# Patient Record
Sex: Male | Born: 1960 | Race: White | Hispanic: No | Marital: Married | Smoking: Never smoker
Health system: Southern US, Community
[De-identification: ages and names within clinical notes are randomized; demographics above are authoritative.]

## PROBLEM LIST (undated history)

## (undated) DIAGNOSIS — R1310 Dysphagia, unspecified: Secondary | ICD-10-CM

## (undated) DIAGNOSIS — K219 Gastro-esophageal reflux disease without esophagitis: Secondary | ICD-10-CM

## (undated) DIAGNOSIS — E785 Hyperlipidemia, unspecified: Secondary | ICD-10-CM

## (undated) HISTORY — PX: OTHER SURGICAL HISTORY: SHX169

---

## 2005-09-10 ENCOUNTER — Encounter (INDEPENDENT_AMBULATORY_CARE_PROVIDER_SITE_OTHER): Payer: Self-pay | Admitting: *Deleted

## 2005-09-10 ENCOUNTER — Ambulatory Visit (HOSPITAL_COMMUNITY): Admission: RE | Admit: 2005-09-10 | Discharge: 2005-09-10 | Payer: Self-pay | Admitting: Internal Medicine

## 2005-09-10 ENCOUNTER — Ambulatory Visit: Payer: Self-pay | Admitting: Internal Medicine

## 2005-09-13 ENCOUNTER — Ambulatory Visit (HOSPITAL_COMMUNITY): Admission: RE | Admit: 2005-09-13 | Discharge: 2005-09-13 | Payer: Self-pay | Admitting: Internal Medicine

## 2005-09-20 ENCOUNTER — Observation Stay (HOSPITAL_COMMUNITY): Admission: EM | Admit: 2005-09-20 | Discharge: 2005-09-22 | Payer: Self-pay | Admitting: Emergency Medicine

## 2009-12-08 ENCOUNTER — Emergency Department (HOSPITAL_COMMUNITY): Admission: EM | Admit: 2009-12-08 | Discharge: 2009-12-08 | Payer: Self-pay | Admitting: Emergency Medicine

## 2009-12-14 ENCOUNTER — Encounter (INDEPENDENT_AMBULATORY_CARE_PROVIDER_SITE_OTHER): Payer: Self-pay | Admitting: *Deleted

## 2010-04-10 NOTE — Letter (Signed)
Summary: Recall, Screening Colonoscopy Only  [facility]  [address]   [place], [place] [address]   Phone: [phone]  Fax: [phone]    December 14, 2009  NORVELL CASWELL [address] E[place] Jarvis [place], [place]  [address] 1961-02-23   Dear Mr. VEASEY,   Our records indicate it is time to schedule your upper endoscopy.  However, our office has been unable to contact you by phone.   Please call our office at [phone] and ask for the referral coordinator.   Thank you,    Ave Filter  [facility] Ph: [phone]   Fax: [phone]

## 2010-07-27 NOTE — H&P (Signed)
Victor Mccoy, Victor Mccoy             ACCOUNT NO.:  [id number]   MEDICAL RECORD NO.:  [id number]          PATIENT TYPE:  OBV   LOCATION:  A321                          FACILITY:  [facility]   PHYSICIAN:  Kingsley Callander. Ouida Sills, MD       DATE OF BIRTH:  03-01-1961   DATE OF ADMISSION:  09/20/2005  DATE OF DISCHARGE:  LH                                HISTORY & PHYSICAL   CHIEF COMPLAINT:  Headache.   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  The patient began feeling a diffuse  throbbing headache approximately two weeks ago.  There had been no known  trauma to his head.  He had been seen at the office and his condition had  been discussed several times by telephone. He had failed treatment with  Relpax.  A prednisone taper was ineffective. He had been mildly hypertensive  and was treated with Tarka without any relief. He had been treated with  hydrocodone and Midrin, as well, without relief. A CT scan of the brain has  been negative. He had experienced periods where if he became completely  still and supine, his headache had improved. He has not had vomiting.  He  has previously been treated for a migraine several years ago.  He has a  family history of migraine in a sister.   PAST MEDICAL HISTORY:  Hypercholesterolemia.   MEDICATIONS:  Lipitor 20 mg daily, Tarka 2/240 mg daily, Vicodin q.4 p.r.n.   ALLERGIES:  None.   SOCIAL HISTORY:  He does not smoke cigarettes.  He does not use recreational  drugs. He does not abuse alcohol   FAMILY HISTORY:  His father has had prostate cancer.  His mother has  hypercholesterolemia.  His sister has migraine headaches and  hypercholesterolemia.   REVIEW OF SYSTEMS:  No vomiting, diarrhea, chest pain, difficulty voiding,  fever, chills, or rash.  He has had multiple tick bites but no erythema  migrans type rash or any  rash to suggest [facility] Spotted Fever.   PHYSICAL EXAMINATION:  VITAL SIGNS:  Temperature 98.6, pulse 94, respirations 18, blood pressure  144/97.  GENERAL: Initially he was extremely uncomfortable appearing.  HEENT: Pupils equal and reactive to light.  Extraocular movements intact.  Oropharynx normal.  Face symmetric.  NECK: Supple.  No JVD or lymphadenopathy.  LUNGS: Clear.  HEART: Regular with no murmurs.  ABDOMEN:  Nontender.  No hepatosplenomegaly.  EXTREMITIES: No cyanosis, clubbing or edema.  NEUROLOGICAL:  Normal.  LYMPH NODES:  No enlargement.  SKIN:  Normal.   LABORATORY DATA:  White count 8.9, hemoglobin 16.3, platelets 285,000.  Sodium 134, potassium 4.4, bicarb 29, glucose 109, BUN 12, creatinine 0.9,  calcium 9.2 AST 24, ALT 56.   IMPRESSION:  Intractable headache. Admit for additional evaluation with a  lumbar puncture and evaluation of cerebrospinal fluid.  I would like to rule  out a low CSF pressure headache.  Will likely consult neurology.  Will treat  with  IV DHE.      Kingsley Callander. Ouida Sills, MD  Electronically Signed     ROF/MEDQ  D:  09/21/2005  T:  09/21/2005  Job:  [id number]

## 2010-07-27 NOTE — Op Note (Signed)
[name], [name]             ACCOUNT NO.:  [id number]   MEDICAL RECORD NO.:  [id number]          PATIENT TYPE:  AMB   LOCATION:  DAY                           FACILITY:  [facility]   PHYSICIAN:  Lionel December, M.D.    DATE OF BIRTH:  1960-05-11   DATE OF PROCEDURE:  09/10/2005  DATE OF DISCHARGE:                                 OPERATIVE REPORT   PROCEDURE:  Esophagogastroduodenoscopy with esophageal dilation.   INDICATIONS:  Victor Mccoy is a 50 year old Caucasian male with a 6 month history  of intermittent dysphagia primarily to solids experienced more and more  frequently but he denies heartburn.  He is undergoing diagnostic and  therapeutic procedure.  Procedure and risks were reviewed with the patient  and informed consent was obtained.   MEDS FOR CONSCIOUS SEDATION:  Benzocaine spray for pharyngeal topical  anesthesia. Demerol 50 mg IV, Versed 10 mg IV.   FINDINGS:  Procedure performed in endoscopy suite.  The patient's vital  signs and O2 sat were monitored during the procedure and remained stable.   The patient was placed in the left lateral decubitus position. The Olympus  videoscope was passed via the oropharynx without any difficulty into  esophagus.   ESOPHAGUS:  The mucosa of the esophagus was normal proximally and middle  third, but distally there was a ring 3 cm proximal to the GE junction was  some mucosal wrinkling.  The GE junction was at 40 cm from the incisors.  No  hernia was noted.   STOMACH:  It was empty and distended very well with insufflation.  The folds  of the proximal stomach were normal.  Examination of the mucosa revealed a  few erosions at antrum, pyloric channel was patent.  Angularis, fundus and  cardia were examined by retroflexing the scope and were normal.   DUODENUM:  Bulbar mucosa was normal.  The scope was passed to the second  part of the duodenum where mucosa and folds were normal.  Endoscope was  withdrawn.   The esophagus was dilated  by passing 54 and 56-French Maloney dilators.  As  the dilation was completed, the endoscope was passed and again there was a 2  cm linear 5-6 mm wide tear at distal esophagus extending about a centimeter  proximal to the GE junction.  Pictures taken for the record.  Biopsy was  taken from mucosa of the distal esophagus away from the tear looking for  eosinophilic esophagitis.  Endoscope was withdrawn.  The patient tolerated  the procedure well.   FINAL DIAGNOSIS:  1.  Distal esophageal ring along with mucosal wrinkling proximal to GE      junction suspicious for eosinophilic esophagitis.  Esophagus dilated by      passing 54 and 56-French Maloney dilator and ring was effectively      disrupted.  2.  Erosive antral gastritis.   Biopsy taken from distal esophagus.   RECOMMENDATIONS:  The patient advised to stay on the soft foods for 2 days.  H pylori serology will be checked today.  I will be contacting the patient  with biopsy results and  further recommendations.      Lionel December, M.D.  Electronically Signed     NR/MEDQ  D:  09/10/2005  T:  09/10/2005  Job:  [id number]   cc:   Kingsley Callander. Ouida Sills, MD  Fax: [phone]

## 2010-07-27 NOTE — Consult Note (Signed)
NAME:  Victor Mccoy, Victor Mccoy             ACCOUNT NO.:  [id number]   MEDICAL RECORD NO.:  [id number]          PATIENT TYPE:  OBV   LOCATION:  A321                          FACILITY:  [facility]   PHYSICIAN:  Kofi A. Gerilyn Pilgrim, M.D. DATE OF BIRTH:  1960/11/29   DATE OF CONSULTATION:  09/20/2005  DATE OF DISCHARGE:  09/22/2005                                   CONSULTATION   HISTORY:  A 50 year old right-handed male who presented to the emergency  room with headache for the past two weeks.  The patient has had history of  headaches that occurs episodically for years but indicates that nothing has  lasted this long.  Essentially both sides of the head are involved, both the  front and posterior aspect.  He does report significant photophobia and  sonophobia.  Some nausea is also reported but no emesis.  The patient has  been treated with Relpax and Imitrex for these recent headaches without much  benefit.  He also was given prednisone taper over a week with no benefit.  Additional treatment includes Tarka, hydrocodone and Midrin, all not  providing significant benefits.  The patient underwent a lumbar spinal tap  which showed an opening pressure of 11 cm of water.  The wbc was 1, rbc 10,  protein of 49, glucose 59, peripheral glucose 109.  Other electrolytes and  liver enzymes unrevealing.  WBC serum is 8.9, hemoglobin 16, platelet count  285.   The patient was started on IV DHE 0.5 mg every six hours and has had  significant improvement, although he has residual headaches for over 10,  again involving both sides of the head.   PAST MEDICAL HISTORY:  Hypercholesterolemia.   MEDICATIONS ON ADMISSION:  1.  Lipitor.  2.  Tarka 2/240.  3.  Vicodin.   ALLERGIES:  NO KNOWN DRUG ALLERGIES.   SOCIAL HISTORY:  Does not smoke, does not use any recreational drugs.  Does  not use alcohol.   FAMILY HISTORY:  Significant for migraines, hypercholesterolemia.   REVIEW OF SYSTEMS:  As stated in the history  of present illness, essentially  unrevealing.  There is no history of tick bites, fevers or rash.   PHYSICAL EXAMINATION:  VITAL SIGNS:  Temperature 98.3, pulse 63,  respirations 20, blood pressure 129/71.  HEENT:  Head is normocephalic and atraumatic.  NECK:  Supple.  LUNGS:  Clear to auscultation bilaterally.  CARDIOVASCULAR:  Normal S1 and S2.  ABDOMEN:  Soft.  EXTREMITIES:  No significant varicosities or edema.  No rashes seen.  MENTATION:  The patient is awake and alert, he converses well.  Speech,  language and cognition are intact.  NEUROLOGIC:  Cranial nerves are equal, round, reactive to light and  accommodation.  Visual fields are intact.  Extraocular movements are full.  Facial muscle strength is symmetric.  Tongue is midline.  Uvula is midline.  Shoulder shrugs are normal.  Motor examination shows normal tone, bulk and  strength.  There is no pronator drift.  Coordination is intact.  Sensation  normal to temperature and light touch.   CT scan of the brain is  unremarkable.   LABORATORY DATA:  Laboratory evaluation had been reviewed in the HPI.   ASSESSMENT:  Likely prolonged migraine in a patient with baseline history of  episodic migrainous headaches.   RECOMMENDATIONS:  Increase DHE to 1 mg every eight hours.  Continue with  verapamil prophylactic care. This will probably take some time to work.   Thank you for this consultation.      Kofi A. Gerilyn Pilgrim, M.D.  Electronically Signed     KAD/MEDQ  D:  09/23/2005  T:  09/23/2005  Job:  [id number]

## 2010-07-27 NOTE — Discharge Summary (Signed)
NAMEGERHARD, RAPPAPORT             ACCOUNT NO.:  [id number]   MEDICAL RECORD NO.:  [id number]          PATIENT TYPE:  OBV   LOCATION:  A321                          FACILITY:  [facility]   PHYSICIAN:  Kingsley Callander. Ouida Sills, MD       DATE OF BIRTH:  [birth year]   DATE OF ADMISSION:  09/20/2005  DATE OF DISCHARGE:  07/15/2007LH                                 DISCHARGE SUMMARY   DISCHARGE DIAGNOSES:  1.  Intractable migraine headache.  2.  Hypertension.  3.  Hypercholesterolemia.   PROCEDURES:  Lumbar puncture.   HOSPITAL COURSE:  This patient is a 50 year old white male who presented  with intractable headache of 2 weeks' duration.  A CT scan had previously  been negative.  He had not experienced fever.  His white count was normal at  8.9.  He underwent a lumbar puncture which revealed an opening pressure of  110 mm of water.  His gram stain of the spinal fluid was negative.  The CSF  protein was mildly elevated at 49.  CSF glucose was normal at 59.  His  culture has remained negative.  I initially attempted the procedure but was  unable to successfully complete it.  Dr. Jena Gauss from radiology was  consulted and with fluoroscopy was able to perform the lumbar puncture.  After low-pressure CSF headache was ruled out, he was felt to likely have  intractable migraine.  He was seen in neurology consultation by Dr. Gerilyn Pilgrim  who agreed.  He was treated with IV dihydroergotamine.  His initial dose was  0.5 mg q.6h.  Dr. Gerilyn Pilgrim increased his dose to 1 mg q.8h.  His headache  improved significantly.  He was stable for discharge on the 15th for  followup in the office in one week.  He will continue on verapamil as a  suppressive agent and will be prescribed Maxalt to use on a p.r.n. basis.   DISCHARGE MEDICATIONS:  1.  Lipitor 110 mg daily.  2.  Tarka 2/240 mg daily.  3.  Ativan 1 mg q.4h. p.r.n.  4.  Maxalt-MLT 10 mg q.d. p.r.n. with a repeat of 1 in 1-2 hours if needed.      Kingsley Callander. Ouida Sills, MD  Electronically Signed     ROF/MEDQ  D:  09/22/2005  T:  09/22/2005  Job:  [id number]

## 2011-08-19 ENCOUNTER — Observation Stay (HOSPITAL_COMMUNITY)
Admission: EM | Admit: 2011-08-19 | Discharge: 2011-08-19 | DRG: 189 | Disposition: A | Discharge status: Home / Self Care | Payer: BC Managed Care – PPO | Attending: General Surgery | Admitting: General Surgery

## 2011-08-19 ENCOUNTER — Other Ambulatory Visit: Payer: Self-pay | Admitting: Gastroenterology

## 2011-08-19 ENCOUNTER — Emergency Department (HOSPITAL_COMMUNITY): Payer: BC Managed Care – PPO

## 2011-08-19 ENCOUNTER — Encounter (HOSPITAL_COMMUNITY)
Admission: EM | Disposition: A | Discharge status: Home / Self Care | Payer: Self-pay | Source: Home / Self Care | Attending: Emergency Medicine

## 2011-08-19 ENCOUNTER — Telehealth: Payer: Self-pay | Admitting: Gastroenterology

## 2011-08-19 ENCOUNTER — Encounter (HOSPITAL_COMMUNITY): Payer: Self-pay | Admitting: Emergency Medicine

## 2011-08-19 DIAGNOSIS — K2970 Gastritis, unspecified, without bleeding: Secondary | ICD-10-CM

## 2011-08-19 DIAGNOSIS — K2990 Gastroduodenitis, unspecified, without bleeding: Secondary | ICD-10-CM | POA: Insufficient documentation

## 2011-08-19 DIAGNOSIS — T18128A Food in esophagus causing other injury, initial encounter: Secondary | ICD-10-CM

## 2011-08-19 DIAGNOSIS — T18108A Unspecified foreign body in esophagus causing other injury, initial encounter: Principal | ICD-10-CM | POA: Diagnosis present

## 2011-08-19 DIAGNOSIS — K222 Esophageal obstruction: Secondary | ICD-10-CM

## 2011-08-19 DIAGNOSIS — R1310 Dysphagia, unspecified: Secondary | ICD-10-CM

## 2011-08-19 DIAGNOSIS — K208 Other esophagitis without bleeding: Secondary | ICD-10-CM

## 2011-08-19 DIAGNOSIS — E785 Hyperlipidemia, unspecified: Secondary | ICD-10-CM | POA: Diagnosis present

## 2011-08-19 DIAGNOSIS — IMO0002 Reserved for concepts with insufficient information to code with codable children: Secondary | ICD-10-CM | POA: Diagnosis present

## 2011-08-19 DIAGNOSIS — K209 Esophagitis, unspecified without bleeding: Secondary | ICD-10-CM | POA: Insufficient documentation

## 2011-08-19 HISTORY — DX: Hyperlipidemia, unspecified: E78.5

## 2011-08-19 HISTORY — DX: Dysphagia, unspecified: R13.10

## 2011-08-19 LAB — DIFFERENTIAL
Basophils Absolute: 0 10*3/uL (ref 0.0–0.1)
Basophils Relative: 1 % (ref 0–1)
Eosinophils Absolute: 0.6 10*3/uL (ref 0.0–0.7)
Eosinophils Relative: 8 % — ABNORMAL HIGH (ref 0–5)
Lymphocytes Relative: 27 % (ref 12–46)
Lymphs Abs: 2.1 10*3/uL (ref 0.7–4.0)
Monocytes Absolute: 0.6 10*3/uL (ref 0.1–1.0)
Monocytes Relative: 8 % (ref 3–12)
Neutrophils Relative %: 57 % (ref 43–77)

## 2011-08-19 LAB — CBC
Hemoglobin: 16.2 g/dL (ref 13.0–17.0)
RBC: 5.18 MIL/uL (ref 4.22–5.81)
RDW: 12.6 % (ref 11.5–15.5)

## 2011-08-19 LAB — BASIC METABOLIC PANEL
Calcium: 9.7 mg/dL (ref 8.4–10.5)
Chloride: 99 mEq/L (ref 96–112)
Creatinine, Ser: 0.95 mg/dL (ref 0.50–1.35)
GFR calc Af Amer: >90 mL/min (ref >90)
Glucose, Bld: 101 mg/dL — ABNORMAL HIGH (ref 70–99)

## 2011-08-19 SURGERY — ESOPHAGOGASTRODUODENOSCOPY (EGD) WITH ESOPHAGEAL DILATION
Anesthesia: Moderate Sedation

## 2011-08-19 MED ORDER — SODIUM CHLORIDE 0.9 % IV BOLUS (SEPSIS)
250.0000 mL | Freq: Once | INTRAVENOUS | Status: AC
  Administered 2011-08-19: 250 mL via INTRAVENOUS

## 2011-08-19 MED ORDER — OMEPRAZOLE 20 MG PO CPDR: DELAYED_RELEASE_CAPSULE | ORAL | Status: DC

## 2011-08-19 MED ORDER — LORAZEPAM 2 MG/ML IJ SOLN
1.0000 mg | Freq: Once | INTRAMUSCULAR | Status: AC
  Administered 2011-08-19: 1 mg via INTRAVENOUS
  Filled 2011-08-19: qty 1

## 2011-08-19 MED ORDER — MIDAZOLAM HCL 5 MG/5ML IJ SOLN
INTRAMUSCULAR | Status: AC
  Filled 2011-08-19: qty 10

## 2011-08-19 MED ORDER — MIDAZOLAM HCL 5 MG/5ML IJ SOLN
INTRAMUSCULAR | Status: DC | PRN
  Administered 2011-08-19 (×2): 2 mg via INTRAVENOUS

## 2011-08-19 MED ORDER — SODIUM CHLORIDE 0.9 % IV SOLN
INTRAVENOUS | Status: DC
  Administered 2011-08-19: 04:00:00 via INTRAVENOUS

## 2011-08-19 MED ORDER — ONDANSETRON HCL 4 MG/2ML IJ SOLN: 4.0000 mg | Freq: Three times a day (TID) | INTRAMUSCULAR | Status: DC | PRN

## 2011-08-19 MED ORDER — BUTAMBEN-TETRACAINE-BENZOCAINE 2-2-14 % EX AERO
INHALATION_SPRAY | CUTANEOUS | Status: DC | PRN
  Administered 2011-08-19: 2 via TOPICAL

## 2011-08-19 MED ORDER — ONDANSETRON HCL 4 MG/2ML IJ SOLN
4.0000 mg | Freq: Once | INTRAMUSCULAR | Status: AC
  Administered 2011-08-19: 4 mg via INTRAVENOUS
  Filled 2011-08-19: qty 2

## 2011-08-19 MED ORDER — SODIUM CHLORIDE 0.9 % IV SOLN: INTRAVENOUS | Status: DC

## 2011-08-19 MED ORDER — MEPERIDINE HCL 100 MG/ML IJ SOLN
INTRAMUSCULAR | Status: AC
  Filled 2011-08-19: qty 1

## 2011-08-19 MED ORDER — SODIUM CHLORIDE 0.45 % IV SOLN
INTRAVENOUS | Status: DC
  Administered 2011-08-19: 11:00:00 via INTRAVENOUS

## 2011-08-19 MED ORDER — GLUCAGON HCL (RDNA) 1 MG IJ SOLR
1.0000 mg | Freq: Once | INTRAMUSCULAR | Status: AC
  Administered 2011-08-19: 1 mg via INTRAVENOUS
  Filled 2011-08-19: qty 1

## 2011-08-19 MED ORDER — MEPERIDINE HCL 100 MG/ML IJ SOLN
INTRAMUSCULAR | Status: DC | PRN
  Administered 2011-08-19 (×2): 50 mg via INTRAVENOUS

## 2011-08-19 MED ORDER — HYDROMORPHONE HCL PF 1 MG/ML IJ SOLN: 1.0000 mg | INTRAMUSCULAR | Status: DC | PRN

## 2011-08-19 NOTE — ED Provider Notes (Addendum)
History     CSN: [id number]  Arrival date & time 08/19/11  0126   First MD Initiated Contact with Patient 08/19/11 0222      Chief Complaint  Patient presents with  . Swallowed Foreign Body    (Consider location/radiation/quality/duration/timing/severity/associated sxs/prior treatment) Patient is a 51 y.o. male presenting with foreign body swallowed. The history is provided by the patient and the spouse.  Swallowed Foreign Body This is a new problem. The current episode started 6 to 12 hours ago (at 8:00 PM). The problem occurs constantly. The problem has not changed since onset.Pertinent negatives include no chest pain, no abdominal pain, no headaches and no shortness of breath. The symptoms are aggravated by nothing. The symptoms are relieved by nothing.   Patient was eating chicken at 8:00 PM it got stuck in his throat is not resolved not able to drink any water comes back up also saliva comes back up. Patient with past history of esophageal stricture with a stretched esophagus by GI medicine about 5 years ago. Past Medical History  Diagnosis Date  . Dysphagia   . Hyperlipidemia     Past Surgical History  Procedure Date  . Stretched esophagus     History reviewed. No pertinent family history.  History  Substance Use Topics  . Smoking status: Never Smoker   . Smokeless tobacco: Not on file  . Alcohol Use: Yes     occasionally      Review of Systems  Constitutional: Negative for fever and chills.  HENT: Positive for trouble swallowing. Negative for congestion.   Eyes: Negative for visual disturbance.  Respiratory: Negative for cough, choking and shortness of breath.   Cardiovascular: Negative for chest pain.  Gastrointestinal: Negative for nausea, vomiting and abdominal pain.  Genitourinary: Negative for dysuria and decreased urine volume.  Musculoskeletal: Negative for back pain.  Skin: Negative for rash.  Neurological: Negative for headaches.  Hematological:  Does not bruise/bleed easily.    Allergies  Review of patient's allergies indicates no known allergies.  Home Medications   Current Outpatient Rx  Name Route Sig Dispense Refill  . ATORVASTATIN CALCIUM 20 MG PO TABS Oral Take 20 mg by mouth daily.      BP 160/98  Pulse 75  Temp(Src) 97.6 F (36.4 C) (Oral)  Resp 20  Ht 6' (1.829 m)  Wt 208 lb (94.348 kg)  BMI 28.21 kg/m2  SpO2 99%  Physical Exam  Nursing note and vitals reviewed. Constitutional: He is oriented to person, place, and time. He appears well-developed and well-nourished. No distress.  HENT:  Head: Normocephalic and atraumatic.  Mouth/Throat: Oropharynx is clear and moist.  Eyes: Conjunctivae and EOM are normal. Pupils are equal, round, and reactive to light.  Neck: Normal range of motion. Neck supple. No tracheal deviation present.  Cardiovascular: Normal rate, regular rhythm and normal heart sounds.   No murmur heard. Pulmonary/Chest: Effort normal and breath sounds normal. No stridor. He has no wheezes.  Abdominal: Soft. Bowel sounds are normal. There is no tenderness.  Musculoskeletal: Normal range of motion.  Lymphadenopathy:    He has no cervical adenopathy.  Neurological: He is alert and oriented to person, place, and time. No cranial nerve deficit. He exhibits normal muscle tone. Coordination normal.  Skin: Skin is warm. No rash noted. He is not diaphoretic.    ED Course  Procedures (including critical care time)  Labs Reviewed  BASIC METABOLIC PANEL - Abnormal; Notable for the following:    Glucose, Bld 101 (*)  All other components within normal limits  DIFFERENTIAL - Abnormal; Notable for the following:    Eosinophils Relative 8 (*)    All other components within normal limits  CBC   Dg Chest 2 View  08/19/2011  *RADIOLOGY REPORT*  Clinical Data: Hoarseness, possible foreign body.  CHEST - 2 VIEW  Comparison: None.  Findings: Lungs are clear. No pleural effusion or pneumothorax. The  cardiomediastinal contours are within normal limits. The visualized bones and soft tissues are without significant appreciable abnormality.  IMPRESSION: No radiopaque foreign body or radiographic evidence of acute cardiopulmonary process.  Original Report Authenticated By: Waneta Martins, M.D.   Results for orders placed during the hospital encounter of 08/19/11  BASIC METABOLIC PANEL      Component Value Range   Sodium 136  135 - 145 (mEq/L)   Potassium 4.5  3.5 - 5.1 (mEq/L)   Chloride 99  96 - 112 (mEq/L)   CO2 27  19 - 32 (mEq/L)   Glucose, Bld 101 (*) 70 - 99 (mg/dL)   BUN 11  6 - 23 (mg/dL)   Creatinine, Ser 1.61  0.50 - 1.35 (mg/dL)   Calcium 9.7  8.4 - 09.6 (mg/dL)   GFR calc non Af Amer >90  >90 (mL/min)   GFR calc Af Amer >90  >90 (mL/min)  CBC      Component Value Range   WBC 7.8  4.0 - 10.5 (K/uL)   RBC 5.18  4.22 - 5.81 (MIL/uL)   Hemoglobin 16.2  13.0 - 17.0 (g/dL)   HCT 04.5  40.9 - 81.1 (%)   MCV 87.6  78.0 - 100.0 (fL)   MCH 31.3  26.0 - 34.0 (pg)   MCHC 35.7  30.0 - 36.0 (g/dL)   RDW 91.4  78.2 - 95.6 (%)   Platelets 247  150 - 400 (K/uL)  DIFFERENTIAL      Component Value Range   Neutrophils Relative 57  43 - 77 (%)   Neutro Abs 4.5  1.7 - 7.7 (K/uL)   Lymphocytes Relative 27  12 - 46 (%)   Lymphs Abs 2.1  0.7 - 4.0 (K/uL)   Monocytes Relative 8  3 - 12 (%)   Monocytes Absolute 0.6  0.1 - 1.0 (K/uL)   Eosinophils Relative 8 (*) 0 - 5 (%)   Eosinophils Absolute 0.6  0.0 - 0.7 (K/uL)   Basophils Relative 1  0 - 1 (%)   Basophils Absolute 0.0  0.0 - 0.1 (K/uL)     1. Food impaction of esophagus       MDM  Patient had chicken stuck in his esophagus the upper part red the base of the neck at about 8:00 in the evening past history of some esophageal stricture problems with dilatation by Dr. Work patient's been trying to drink water will not go down saliva also comes back up here in the emergency department treated him with glucagon and some Ativan no  change. Discussed with Dr. Leticia Penna is on call for general surgery admitting tonight for GI he will omit and contact GI medicine in the next few hours to remove the foreign body. Patient's chest x-ray is negative for any evidence of esophageal perforation.        Shelda Jakes, MD 08/19/11 2130  Shelda Jakes, MD 08/20/11 [phone]

## 2011-08-19 NOTE — ED Notes (Signed)
Patient stated he swallowed one bite of chicken and one bite of salad and food is stuck in his throat. Has tried to drink water but states "it builds up and comes back up." Has history of same.

## 2011-08-19 NOTE — Care Management Note (Signed)
    Page 1 of 1   08/19/2011     10:35:14 AM   CARE MANAGEMENT NOTE 08/19/2011  Patient:  Victor Mccoy, Victor Mccoy   Account Number:  [id number]  Date Initiated:  08/19/2011  Documentation initiated by:  Sharrie Rothman  Subjective/Objective Assessment:   Pt admitted from  home with food impaction. Pt lives with wife and is independent with ADL's. Pt will return home at discharge.     Action/Plan:   No CM needs noted.   Anticipated DC Date:  08/21/2011   Anticipated DC Plan:  HOME/SELF CARE      DC Planning Services  CM consult      Choice offered to / List presented to:             Status of service:  Completed, signed off Medicare Important Message given?   (If response is "NO", the following Medicare IM given date fields will be blank) Date Medicare IM given:   Date Additional Medicare IM given:    Discharge Disposition:    Per UR Regulation:    If discussed at Long Length of Stay Meetings, dates discussed:    Comments:  08/19/11 1034 Arlyss Queen, RN BSN CM

## 2011-08-19 NOTE — Telephone Encounter (Deleted)
Ok. Thank You! °

## 2011-08-19 NOTE — ED Notes (Signed)
Gave patient water to drink as verbally ordered by MD. Patient took 2-3 swallows of water and vomited within 30 seconds of drinking water. Advised MD.

## 2011-08-19 NOTE — Telephone Encounter (Signed)
Pt needs EGD/DILATION TIMESLOT.

## 2011-08-19 NOTE — Telephone Encounter (Signed)
I have patient scheduled for repeat EGD w/SLF on Tuesday 08/27/11 at 10:30, patients prep instructions were mailed.

## 2011-08-19 NOTE — H&P (Addendum)
  Primary Care Physician:  FAGAN,ROY, MD, MD Primary Gastroenterologist:  Dr. Keiran Sias  Pre-Procedure History & Physical: HPI:  Victor Mccoy is a 51 y.o. male here for ?FOOD IMPACTION-GRILLED CHICKEN/SALAD LAST NIGHT AROUND 8 PM. No NSAIDS. RARE ETOH. No problems with sedation. No nausea, vomiting, melena, diarrhea, constipation, and pain, or heartburn or indigestion. NEVER HAD A TCS.    Past Medical History  Diagnosis Date  . Dysphagia   . Hyperlipidemia     Past Surgical History  Procedure Date  . Stretched esophagus     Prior to Admission medications   Medication Sig Start Date End Date Taking? Authorizing Provider  atorvastatin (LIPITOR) 20 MG tablet Take 20 mg by mouth daily.   Yes Historical Provider, MD  fenofibrate (TRICOR) 145 MG tablet Take 145 mg by mouth daily.   Yes Historical Provider, MD    Allergies as of 08/19/2011  . (No Known Allergies)    History reviewed. No pertinent family history.  History   Social History  . Marital Status: Single    Spouse Name: N/A    Number of Children: N/A  . Years of Education: N/A   Occupational History  . Not on file.   Social History Main Topics  . Smoking status: Never Smoker   . Smokeless tobacco: Not on file  . Alcohol Use: Yes     occasionally  . Drug Use: No  . Sexually Active:    Other Topics Concern  . Not on file   Social History Narrative  . No narrative on file    Review of Systems: See HPI, otherwise negative ROS   Physical Exam: BP 157/103  Pulse 74  Temp(Src) 98.2 F (36.8 C) (Oral)  Resp 18  Ht 6' (1.829 m)  Wt 210 lb (95.255 kg)  BMI 28.48 kg/m2  SpO2 95% General:   Alert,  pleasant and cooperative in NAD Head:  Normocephalic and atraumatic. Neck:  Supple;  Lungs:  Clear throughout to auscultation.    Heart:  Regular rate and rhythm. Abdomen:  Soft, nontender and nondistended. Normal bowel sounds, without guarding, and without rebound.   Neurologic:  Alert and  oriented x4;   grossly normal neurologically.  Impression/Plan:     FOOD IMPACTION  PLAN:  EGD/?DIL TODAY  TCS IN NEAR FUTURE 

## 2011-08-19 NOTE — ED Notes (Signed)
Patient states he ate one bite of chicken and one bite of salad and food is stuck and will not go down. States "this happens sometimes but I usually drink water and it will finally go down, but tonight it won't go down." No obvious distress noted.

## 2011-08-19 NOTE — Progress Notes (Signed)
Brief Note  Pt trigger on Nutrition Screen due to swallow difficulty. Pt currently NPO and has hx of esophageal stricture with dilatation. His appetite reported to be good and wt stable. No nutrition intervention recommended at this time.  Dietitian [phone]

## 2011-08-19 NOTE — Op Note (Signed)
[facility] [address] [place], [place]  [address]  ENDOSCOPY PROCEDURE REPORT  PATIENT:  Victor, Mccoy  MR#:  [id number] BIRTHDATE:  04/06/1960, 50 yrs. old  GENDER:  male  ENDOSCOPIST:  Jonette Eva, MD ASSISTANT: Referred by:  Carylon Perches, M.D.  PROCEDURE DATE:  08/19/2011 PROCEDURE:  EGD with biopsy, EGD with dilatation over guidewire ASA CLASS: INDICATIONS:  FOOD IMPACTION-DYSPHAGIA DENIES HEARTBURN  PMHX: EGD/DIL 2007-DISTAL Bx EOSINOPHILS-GERD V. EE  MEDICATIONS:   Demerol 100 mg IV, Versed 4 mg IV TOPICAL ANESTHETIC:  Cetacaine Spray  DESCRIPTION OF PROCEDURE:   After the risks benefits and alternatives of the procedure were thoroughly explained, informed consent was obtained.  The EG-[id number] ([id number]) endoscope was introduced through the mouth and advanced to the second portion of the duodenum.  The instrument was slowly withdrawn as the mucosa was carefully examined.  Prior to withdrawal of the scope, the guidwire was placed.  The esophagus was dilated successfully.  The patient was recovered in endoscopy and discharged home in satisfactory condition. <<PROCEDUREIMAGES>>  Esophagitis was found in the distal esophagus LIKEY DUE TO FOOD IMPACTION.  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  Mild gastritis was found & BIOPSIED VIA COLD FORCEPS. MILD Duodenitis was found.    Dilation was then performed at the total esophagus  1) Dilator:  Savary over guidewire  Size(s):  12-14 MM Resistance:  moderate  Heme:  none Appearance:  COMPLICATIONS:  None  ENDOSCOPIC IMPRESSION: 1) Mild gastritis 2) MILD Duodenitis 3) FOOD IMPACTION LIKEY DUE TO PEPTIC STRICTURE. BIOPSIES PENDING TO EVALAUTE FOR EE  RECOMMENDATIONS: PPI BID FOR 3 MOS THEN DAILY FOREVER AWAIT BIOPSIES SOFT MECH LOW FAT DIET REPEAT EGD/DIL JUN 18 OPV IN 4 MOS  REPEAT EXAM:  No  ______________________________ Jonette Eva, MD  CC:  n. eSIGNED:   Rashay Barnette at  08/19/2011 12:49 PM  Arna Snipe, [phone]

## 2011-08-19 NOTE — Discharge Instructions (Signed)
Your FOOD IMPACTION WAS GONE. I dilated your esophagus. You have a stricture near the base of your esophagus. You have mild gastritis AND DUODENITIS. I biopsied your stomach & ESOPHAGUS.   START OMEPRAZOLE. TAKE 30 MINUTES PRIOR TO MEALS. LOSE 10 LBS. FOLLOW A LOW FAT DIET. SEE INFO BELOW. YOUR BIOPSY WILL BE BACK IN 7 DAYS. REPEAT UPPER ENDOSCOPY June 18. FOLLOW UP IN 3 MOS. UPPER ENDOSCOPY AFTER CARE Read the instructions outlined below and refer to this sheet in the next week. These discharge instructions provide you with general information on caring for yourself after you leave the hospital. While your treatment has been planned according to the most current medical practices available, unavoidable complications occasionally occur. If you have any problems or questions after discharge, call DR. Hermenia Fritcher, [phone].  ACTIVITY  You may resume your regular activity, but move at a slower pace for the next 24 hours.   Take frequent rest periods for the next 24 hours.   Walking will help get rid of the air and reduce the bloated feeling in your belly (abdomen).   No driving for 24 hours (because of the medicine (anesthesia) used during the test).   You may shower.   Do not sign any important legal documents or operate any machinery for 24 hours (because of the anesthesia used during the test).    NUTRITION  Drink plenty of fluids.   You may resume your normal diet as instructed by your doctor.   Begin with a light meal and progress to your normal diet. Heavy or fried foods are harder to digest and may make you feel sick to your stomach (nauseated).   Avoid alcoholic beverages for 24 hours or as instructed.    MEDICATIONS  You may resume your normal medications.   WHAT YOU CAN EXPECT TODAY  Some feelings of bloating in the abdomen.   Passage of more gas than usual.    IF YOU HAD A BIOPSY TAKEN DURING THE UPPER ENDOSCOPY:  No aspirin products for 3 days.   Eat a  soft diet IF YOU HAVE NAUSEA, BLOATING, ABDOMINAL PAIN, OR VOMITING.    FINDING OUT THE RESULTS OF YOUR TEST Not all test results are available during your visit. DR. Darrick Penna WILL CALL YOU WITHIN 7 DAYS OF YOUR PROCEDUE WITH YOUR RESULTS. Do not assume everything is normal if you have not heard from DR. Quashawn Jewkes IN ONE WEEK, CALL HER OFFICE AT [phone].  SEEK IMMEDIATE MEDICAL ATTENTION AND CALL THE OFFICE: [phone] IF:  You have more than a spotting of blood in your stool.   Your belly is swollen (abdominal distention).   You are nauseated or vomiting.   You have a temperature over 101F.   You have abdominal pain or discomfort that is severe or gets worse throughout the day.     Gastritis/DUODENITIS  Gastritis is an inflammation (the body's way of reacting to injury and/or infection) of the stomach. DUODENITIS is an inflammation (the body's way of reacting to injury and/or infection) of the FIRST PART OF THE SMALL INTESTINES. It is often caused by bacterial (germ) infections. It can also be caused BY ASPIRIN, BC/GOODY POWDER'S, (IBUPROFEN) MOTRIN, OR ALEVE (NAPROXEN), chemicals (including alcohol), SPICY FOODS, and medications. This illness may be associated with generalized malaise (feeling tired, not well), UPPER ABDOMINAL STOMACH cramps, and fever. One common bacterial cause of gastritis is an organism known as H. Pylori. This can be treated with antibiotics.     ESOPHAGEAL STRICTURE Esophageal strictures  can be caused by stomach acid backing up into the tube that carries food from the mouth down to the stomach (lower esophagus).  TREATMENT There are a number of non-prescription medicines used to treat reflux/stricture, including: Antacids.  Proton-pump inhibitors: OMEPRAZOLE   HOME CARE INSTRUCTIONS Eat 2-3 hours before going to bed.  Try to reach and maintain a healthy weight.  Do not eat just a few very large meals. Instead, eat 4 TO 6 smaller meals throughout the  day.  Try to identify foods and beverages that make your symptoms worse, and avoid these.  Avoid tight clothing.  Do not exercise right after eating.   [email or web address]  Lifestyle and home remedies You may eliminate or reduce the frequency of heartburn by making the following lifestyle changes:   Control your weight. Being overweight is a major risk factor for heartburn and GERD. Excess pounds put pressure on your abdomen, pushing up your stomach and causing acid to back up into your esophagus.    Eat 4-6 small meals daily. This reduces pressure on the lower esophageal sphincter, helping to prevent the valve from opening and acid from washing back into your esophagus.    Loosen your belt. Clothes that fit tightly around your waist put pressure on your abdomen and the lower esophageal sphincter.      Eliminate heartburn triggers. Everyone has specific triggers. Common triggers such as fatty or fried foods, spicy food, tomato sauce, carbonated beverages, alcohol, chocolate, mint, garlic, onion, caffeine and nicotine may make heartburn worse.    Avoid stooping or bending. Tying your shoes is OK. Bending over for longer periods to weed your garden isn't, especially soon after eating.    Don't lie down after a meal. Wait at least three to four hours after eating before going to bed, and don't lie down right after eating.    Alternative medicine   Several home remedies exist for treating GERD, but they provide only temporary relief. They include drinking baking soda (sodium bicarbonate) added to water or drinking other fluids such as baking soda mixed with cream of tartar and water.   Although these liquids create temporary relief by neutralizing, washing away or buffering acids, eventually they aggravate the situation by adding gas and fluid to your stomach, increasing pressure and causing more acid reflux. Further, adding more sodium to your diet may increase your blood pressure and add stress  to your heart, and excessive bicarbonate ingestion can alter the acid-base balance in your body.  Low-Fat Diet BREADS, CEREALS, PASTA, RICE, DRIED PEAS, AND BEANS These products are high in carbohydrates and most are low in fat. Therefore, they can be increased in the diet as substitutes for fatty foods. They too, however, contain calories and should not be eaten in excess. Cereals can be eaten for snacks as well as for breakfast.  Include foods that contain fiber (fruits, vegetables, whole grains, and legumes). Research shows that fiber may lower blood cholesterol levels, especially the water-soluble fiber found in fruits, vegetables, oat products, and legumes. FRUITS AND VEGETABLES It is good to eat fruits and vegetables. Besides being sources of fiber, both are rich in vitamins and some minerals. They help you get the daily allowances of these nutrients. Fruits and vegetables can be used for snacks and desserts. MEATS Limit lean meat, chicken, [place], and fish to no more than 6 ounces per day. Beef, Pork, and Lamb Use lean cuts of beef, pork, and lamb. Lean cuts include:  Extra-lean ground beef.  Arm  roast.  Sirloin tip.  Center-cut ham.  Round steak.  Loin chops.  Rump roast.  Tenderloin.  Trim all fat off the outside of meats before cooking. It is not necessary to severely decrease the intake of red meat, but lean choices should be made. Lean meat is rich in protein and contains a highly absorbable form of iron. Premenopausal women, in particular, should avoid reducing lean red meat because this could increase the risk for low red blood cells (iron-deficiency anemia).  Chicken and [place] These are good sources of protein. The fat of poultry can be reduced by removing the skin and underlying fat layers before cooking. Chicken and [place] can be substituted for lean red meat in the diet. Poultry should not be fried or covered with high-fat sauces. Fish and Shellfish Fish is a good source  of protein. Shellfish contain cholesterol, but they usually are low in saturated fatty acids. The preparation of fish is important. Like chicken and [place], they should not be fried or covered with high-fat sauces. EGGS Egg whites contain no fat or cholesterol. They can be eaten often. Try 1 to 2 egg whites instead of whole eggs in recipes or use egg substitutes that do not contain yolk.  MILK AND DAIRY PRODUCTS Use skim or 1% milk instead of 2% or whole milk. Decrease whole milk, natural, and processed cheeses. Use nonfat or low-fat (2%) cottage cheese or low-fat cheeses made from vegetable oils. Choose nonfat or low-fat (1 to 2%) yogurt. Experiment with evaporated skim milk in recipes that call for heavy cream. Substitute low-fat yogurt or low-fat cottage cheese for sour cream in dips and salad dressings. Have at least 2 servings of low-fat dairy products, such as 2 glasses of skim (or 1%) milk each day to help get your daily calcium intake.  FATS AND OILS Butterfat, lard, and beef fats are high in saturated fat and cholesterol. These should be avoided.Vegetable fats do not contain cholesterol. AVOID coconut oil, palm oil, and palm kernel oil, WHICH are very high in saturated fats. These should be limited. These fats are often used in bakery goods, processed foods, popcorn, oils, and nondairy creamers. Vegetable shortenings and some peanut butters contain hydrogenated oils, which are also saturated fats. Read the labels on these foods and check for saturated vegetable oils.  Desirable liquid vegetable oils are corn oil, cottonseed oil, olive oil, canola oil, safflower oil, soybean oil, and sunflower oil. Peanut oil is not as good, but small amounts are acceptable. Buy a heart-healthy tub margarine that has no partially hydrogenated oils in the ingredients. AVOID Mayonnaise and salad dressings often are made from unsaturated fats.  OTHER EATING TIPS Snacks  Most sweets should be limited as snacks.  They tend to be rich in calories and fats, and their caloric content outweighs their nutritional value. Some good choices in snacks are graham crackers, melba toast, soda crackers, bagels (no egg), English muffins, fruits, and vegetables. These snacks are preferable to snack crackers, [place] fries, and chips. Popcorn should be air-popped or cooked in small amounts of liquid vegetable oil.  Desserts Eat fruit, low-fat yogurt, and fruit ices instead of pastries, cake, and cookies. Sherbet, angel food cake, gelatin dessert, frozen low-fat yogurt, or other frozen products that do not contain saturated fat (pure fruit juice bars, frozen ice pops) are also acceptable.   COOKING METHODS Choose those methods that use little or no fat. They include: Poaching.  Braising.  Steaming.  Grilling.  Baking.  Stir-frying.  Broiling.  Microwaving.  Foods can be cooked in a nonstick pan without added fat, or use a nonfat cooking spray in regular cookware. Limit fried foods and avoid frying in saturated fat. Add moisture to lean meats by using water, broth, cooking wines, and other nonfat or low-fat sauces along with the cooking methods mentioned above. Soups and stews should be chilled after cooking. The fat that forms on top after a few hours in the refrigerator should be skimmed off. When preparing meals, avoid using excess salt. Salt can contribute to raising blood pressure in some people.  EATING AWAY FROM HOME Order entres, potatoes, and vegetables without sauces or butter. When meat exceeds the size of a deck of cards (3 to 4 ounces), the rest can be taken home for another meal. Choose vegetable or fruit salads and ask for low-calorie salad dressings to be served on the side. Use dressings sparingly. Limit high-fat toppings, such as bacon, crumbled eggs, cheese, sunflower seeds, and olives. Ask for heart-healthy tub margarine instead of butter.

## 2011-08-19 NOTE — Progress Notes (Signed)
UR chart review completed.  

## 2011-08-19 NOTE — Progress Notes (Signed)
Received call from [facility] who stated patient will be discharged from recovery area.

## 2011-08-26 MED ORDER — SODIUM CHLORIDE 0.45 % IV SOLN
Freq: Once | INTRAVENOUS | Status: AC
  Administered 2011-08-27: 10:00:00 via INTRAVENOUS

## 2011-08-26 NOTE — Discharge Summary (Signed)
Date of admission 08/19/2011 Date of discharge 08/19/2011   Admission diagnoses: Food impaction of the esophagus Discharge diagnoses: Apparently the same.   Patient was admitted early the morning of 08/19/2011 after evaluation in the emergency department was suspicious for a food impaction of the esophagus. He was admitted temporarily to my service as gastroenterology was not mediately available. He was kept in an n.p.o. status with plans for GI evaluation later in the morning for planned and suspected EGD. He was evaluated and taken to endoscopy by Dr. Darrick Penna.  Both patients initial evaluation and discharge were performed by Dr. Darrick Penna.  Note: I did not have any direct patient care involvement.

## 2011-08-27 ENCOUNTER — Encounter (HOSPITAL_COMMUNITY): Admission: RE | Payer: Self-pay | Source: Ambulatory Visit

## 2011-08-27 ENCOUNTER — Ambulatory Visit (HOSPITAL_COMMUNITY)
Admission: RE | Admit: 2011-08-27 | Discharge: 2011-08-27 | Disposition: A | Discharge status: Home / Self Care | Payer: BC Managed Care – PPO | Source: Ambulatory Visit | Attending: Gastroenterology | Admitting: Gastroenterology

## 2011-08-27 ENCOUNTER — Ambulatory Visit (HOSPITAL_COMMUNITY)
Admission: RE | Admit: 2011-08-27 | Payer: BC Managed Care – PPO | Source: Ambulatory Visit | Admitting: Gastroenterology

## 2011-08-27 ENCOUNTER — Encounter (HOSPITAL_COMMUNITY)
Admission: RE | Disposition: A | Discharge status: Home / Self Care | Payer: Self-pay | Source: Ambulatory Visit | Attending: Gastroenterology

## 2011-08-27 ENCOUNTER — Encounter (HOSPITAL_COMMUNITY): Payer: Self-pay | Admitting: *Deleted

## 2011-08-27 DIAGNOSIS — K2990 Gastroduodenitis, unspecified, without bleeding: Secondary | ICD-10-CM

## 2011-08-27 DIAGNOSIS — K2970 Gastritis, unspecified, without bleeding: Secondary | ICD-10-CM

## 2011-08-27 DIAGNOSIS — K2940 Chronic atrophic gastritis without bleeding: Secondary | ICD-10-CM | POA: Insufficient documentation

## 2011-08-27 DIAGNOSIS — K222 Esophageal obstruction: Secondary | ICD-10-CM

## 2011-08-27 DIAGNOSIS — K2100 Gastro-esophageal reflux disease with esophagitis, without bleeding: Secondary | ICD-10-CM | POA: Insufficient documentation

## 2011-08-27 DIAGNOSIS — R1310 Dysphagia, unspecified: Secondary | ICD-10-CM | POA: Insufficient documentation

## 2011-08-27 HISTORY — PX: MALONEY DILATION: SHX5535

## 2011-08-27 HISTORY — PX: SAVORY DILATION: SHX5439

## 2011-08-27 HISTORY — DX: Gastro-esophageal reflux disease without esophagitis: K21.9

## 2011-08-27 SURGERY — EGD (ESOPHAGOGASTRODUODENOSCOPY)
Anesthesia: Moderate Sedation

## 2011-08-27 SURGERY — ESOPHAGOGASTRODUODENOSCOPY (EGD) WITH ESOPHAGEAL DILATION
Anesthesia: Moderate Sedation

## 2011-08-27 MED ORDER — OMEPRAZOLE 20 MG PO CPDR: DELAYED_RELEASE_CAPSULE | ORAL | Status: DC

## 2011-08-27 MED ORDER — MEPERIDINE HCL 100 MG/ML IJ SOLN
INTRAMUSCULAR | Status: DC | PRN
  Administered 2011-08-27: 50 mg via INTRAVENOUS
  Administered 2011-08-27: 25 mg via INTRAVENOUS

## 2011-08-27 MED ORDER — FLUTICASONE PROPIONATE HFA 220 MCG/ACT IN AERO
INHALATION_SPRAY | RESPIRATORY_TRACT | Status: AC
Start: 2011-08-27 — End: 2012-08-26

## 2011-08-27 MED ORDER — MIDAZOLAM HCL 5 MG/5ML IJ SOLN
INTRAMUSCULAR | Status: DC | PRN
  Administered 2011-08-27 (×2): 2 mg via INTRAVENOUS

## 2011-08-27 MED ORDER — BUTAMBEN-TETRACAINE-BENZOCAINE 2-2-14 % EX AERO
INHALATION_SPRAY | CUTANEOUS | Status: DC | PRN
  Administered 2011-08-27: 2 via TOPICAL

## 2011-08-27 MED ORDER — MEPERIDINE HCL 100 MG/ML IJ SOLN
INTRAMUSCULAR | Status: AC
  Filled 2011-08-27: qty 1

## 2011-08-27 MED ORDER — MIDAZOLAM HCL 5 MG/5ML IJ SOLN
INTRAMUSCULAR | Status: AC
  Filled 2011-08-27: qty 10

## 2011-08-27 MED ORDER — STERILE WATER FOR IRRIGATION IR SOLN
Status: DC | PRN
  Administered 2011-08-27: 10:00:00

## 2011-08-27 MED ORDER — MINERAL OIL PO OIL
TOPICAL_OIL | ORAL | Status: AC
  Filled 2011-08-27: qty 30

## 2011-08-27 NOTE — H&P (View-Only) (Signed)
  Primary Care Physician:  Carylon Perches, MD, MD Primary Gastroenterologist:  Dr. Darrick Penna  Pre-Procedure History & Physical: HPI:  Victor Mccoy is a 51 y.o. male here for ?FOOD IMPACTION-GRILLED CHICKEN/SALAD LAST NIGHT AROUND 8 PM. No NSAIDS. RARE ETOH. No problems with sedation. No nausea, vomiting, melena, diarrhea, constipation, and pain, or heartburn or indigestion. NEVER HAD A TCS.    Past Medical History  Diagnosis Date  . Dysphagia   . Hyperlipidemia     Past Surgical History  Procedure Date  . Stretched esophagus     Prior to Admission medications   Medication Sig Start Date End Date Taking? Authorizing Provider  atorvastatin (LIPITOR) 20 MG tablet Take 20 mg by mouth daily.   Yes Historical Provider, MD  fenofibrate (TRICOR) 145 MG tablet Take 145 mg by mouth daily.   Yes Historical Provider, MD    Allergies as of 08/19/2011  . (No Known Allergies)    History reviewed. No pertinent family history.  History   Social History  . Marital Status: Single    Spouse Name: N/A    Number of Children: N/A  . Years of Education: N/A   Occupational History  . Not on file.   Social History Main Topics  . Smoking status: Never Smoker   . Smokeless tobacco: Not on file  . Alcohol Use: Yes     occasionally  . Drug Use: No  . Sexually Active:    Other Topics Concern  . Not on file   Social History Narrative  . No narrative on file    Review of Systems: See HPI, otherwise negative ROS   Physical Exam: BP 157/103  Pulse 74  Temp(Src) 98.2 F (36.8 C) (Oral)  Resp 18  Ht 6' (1.829 m)  Wt 210 lb (95.255 kg)  BMI 28.48 kg/m2  SpO2 95% General:   Alert,  pleasant and cooperative in NAD Head:  Normocephalic and atraumatic. Neck:  Supple;  Lungs:  Clear throughout to auscultation.    Heart:  Regular rate and rhythm. Abdomen:  Soft, nontender and nondistended. Normal bowel sounds, without guarding, and without rebound.   Neurologic:  Alert and  oriented x4;   grossly normal neurologically.  Impression/Plan:     FOOD IMPACTION  PLAN:  EGD/?DIL TODAY  TCS IN NEAR FUTURE

## 2011-08-27 NOTE — Discharge Instructions (Signed)
I dilated your esophagus. You have a stricture near the base of your esophagus. You have gastritis.   START FLUTICASONE 220 mcg inhaler, two sprays twice daily. Do not use a spacer. SPRAY into YOUR mouth and then swallow. DO NOT inhale when the medication is being delivered, DO NOT EAT or drink for 30 minutes following administration.  USE PRILOSEC AS NEEDED FOR HEART BURN OR INDIGESTION.  FOLLOW A LOW FAT DIET. SEE INFO BELOW.  FOLLOW UP IN SEP 2013.   UPPER ENDOSCOPY AFTER CARE Read the instructions outlined below and refer to this sheet in the next week. These discharge instructions provide you with general information on caring for yourself after you leave the hospital. While your treatment has been planned according to the most current medical practices available, unavoidable complications occasionally occur. If you have any problems or questions after discharge, call DR. Jaylani Mcguinn, [phone].  ACTIVITY  You may resume your regular activity, but move at a slower pace for the next 24 hours.   Take frequent rest periods for the next 24 hours.   Walking will help get rid of the air and reduce the bloated feeling in your belly (abdomen).   No driving for 24 hours (because of the medicine (anesthesia) used during the test).   You may shower.   Do not sign any important legal documents or operate any machinery for 24 hours (because of the anesthesia used during the test).    NUTRITION  Drink plenty of fluids.   You may resume your normal diet as instructed by your doctor.   Begin with a light meal and progress to your normal diet. Heavy or fried foods are harder to digest and may make you feel sick to your stomach (nauseated).   Avoid alcoholic beverages for 24 hours or as instructed.    MEDICATIONS  You may resume your normal medications.   WHAT YOU CAN EXPECT TODAY  Some feelings of bloating in the abdomen.   Passage of more gas than usual.    IF YOU HAD A  BIOPSY TAKEN DURING THE UPPER ENDOSCOPY:  Eat a soft diet IF YOU HAVE NAUSEA, BLOATING, ABDOMINAL PAIN, OR VOMITING.    FINDING OUT THE RESULTS OF YOUR TEST Not all test results are available during your visit. DR. Darrick Penna WILL CALL YOU WITHIN 7 DAYS OF YOUR PROCEDUE WITH YOUR RESULTS. Do not assume everything is normal if you have not heard from DR. Thania Woodlief IN ONE WEEK, CALL HER OFFICE AT [phone].  SEEK IMMEDIATE MEDICAL ATTENTION AND CALL THE OFFICE: [phone] IF:  You have more than a spotting of blood in your stool.   Your belly is swollen (abdominal distention).   You are nauseated or vomiting.   You have a temperature over 101F.   You have abdominal pain or discomfort that is severe or gets worse throughout the day.    Low-Fat Diet BREADS, CEREALS, PASTA, RICE, DRIED PEAS, AND BEANS These products are high in carbohydrates and most are low in fat. Therefore, they can be increased in the diet as substitutes for fatty foods. They too, however, contain calories and should not be eaten in excess. Cereals can be eaten for snacks as well as for breakfast.  Include foods that contain fiber (fruits, vegetables, whole grains, and legumes). Research shows that fiber may lower blood cholesterol levels, especially the water-soluble fiber found in fruits, vegetables, oat products, and legumes. FRUITS AND VEGETABLES It is good to eat fruits and vegetables. Besides being sources of  fiber, both are rich in vitamins and some minerals. They help you get the daily allowances of these nutrients. Fruits and vegetables can be used for snacks and desserts. MEATS Limit lean meat, chicken, [place], and fish to no more than 6 ounces per day. Beef, Pork, and Lamb Use lean cuts of beef, pork, and lamb. Lean cuts include:  Extra-lean ground beef.  Arm roast.  Sirloin tip.  Center-cut ham.  Round steak.  Loin chops.  Rump roast.  Tenderloin.  Trim all fat off the outside of meats before cooking.  It is not necessary to severely decrease the intake of red meat, but lean choices should be made. Lean meat is rich in protein and contains a highly absorbable form of iron. Premenopausal women, in particular, should avoid reducing lean red meat because this could increase the risk for low red blood cells (iron-deficiency anemia).  Chicken and [place] These are good sources of protein. The fat of poultry can be reduced by removing the skin and underlying fat layers before cooking. Chicken and [place] can be substituted for lean red meat in the diet. Poultry should not be fried or covered with high-fat sauces. Fish and Shellfish Fish is a good source of protein. Shellfish contain cholesterol, but they usually are low in saturated fatty acids. The preparation of fish is important. Like chicken and [place], they should not be fried or covered with high-fat sauces. EGGS Egg whites contain no fat or cholesterol. They can be eaten often. Try 1 to 2 egg whites instead of whole eggs in recipes or use egg substitutes that do not contain yolk.  MILK AND DAIRY PRODUCTS Use skim or 1% milk instead of 2% or whole milk. Decrease whole milk, natural, and processed cheeses. Use nonfat or low-fat (2%) cottage cheese or low-fat cheeses made from vegetable oils. Choose nonfat or low-fat (1 to 2%) yogurt. Experiment with evaporated skim milk in recipes that call for heavy cream. Substitute low-fat yogurt or low-fat cottage cheese for sour cream in dips and salad dressings. Have at least 2 servings of low-fat dairy products, such as 2 glasses of skim (or 1%) milk each day to help get your daily calcium intake.  FATS AND OILS Butterfat, lard, and beef fats are high in saturated fat and cholesterol. These should be avoided.Vegetable fats do not contain cholesterol. AVOID coconut oil, palm oil, and palm kernel oil, WHICH are very high in saturated fats. These should be limited. These fats are often used in bakery goods, processed  foods, popcorn, oils, and nondairy creamers. Vegetable shortenings and some peanut butters contain hydrogenated oils, which are also saturated fats. Read the labels on these foods and check for saturated vegetable oils.  Desirable liquid vegetable oils are corn oil, cottonseed oil, olive oil, canola oil, safflower oil, soybean oil, and sunflower oil. Peanut oil is not as good, but small amounts are acceptable. Buy a heart-healthy tub margarine that has no partially hydrogenated oils in the ingredients. AVOID Mayonnaise and salad dressings often are made from unsaturated fats.  OTHER EATING TIPS Snacks  Most sweets should be limited as snacks. They tend to be rich in calories and fats, and their caloric content outweighs their nutritional value. Some good choices in snacks are graham crackers, melba toast, soda crackers, bagels (no egg), English muffins, fruits, and vegetables. These snacks are preferable to snack crackers, [place] fries, and chips. Popcorn should be air-popped or cooked in small amounts of liquid vegetable oil.  Desserts Eat fruit, low-fat yogurt,  and fruit ices instead of pastries, cake, and cookies. Sherbet, angel food cake, gelatin dessert, frozen low-fat yogurt, or other frozen products that do not contain saturated fat (pure fruit juice bars, frozen ice pops) are also acceptable.   COOKING METHODS Choose those methods that use little or no fat. They include: Poaching.  Braising.  Steaming.  Grilling.  Baking.  Stir-frying.  Broiling.  Microwaving.  Foods can be cooked in a nonstick pan without added fat, or use a nonfat cooking spray in regular cookware. Limit fried foods and avoid frying in saturated fat. Add moisture to lean meats by using water, broth, cooking wines, and other nonfat or low-fat sauces along with the cooking methods mentioned above. Soups and stews should be chilled after cooking. The fat that forms on top after a few hours in the refrigerator should be  skimmed off. When preparing meals, avoid using excess salt. Salt can contribute to raising blood pressure in some people.  EATING AWAY FROM HOME Order entres, potatoes, and vegetables without sauces or butter. When meat exceeds the size of a deck of cards (3 to 4 ounces), the rest can be taken home for another meal. Choose vegetable or fruit salads and ask for low-calorie salad dressings to be served on the side. Use dressings sparingly. Limit high-fat toppings, such as bacon, crumbled eggs, cheese, sunflower seeds, and olives. Ask for heart-healthy tub margarine instead of butter.   Gastritis  Gastritis is an inflammation (the body's way of reacting to injury and/or infection) of the stomach. It is often caused by viral or bacterial (germ) infections. It can also be caused BY ASPIRIN, BC/GOODY POWDER'S, (IBUPROFEN) MOTRIN, OR ALEVE (NAPROXEN), chemicals (including alcohol), SPICY FOODS, and medications. This illness may be associated with generalized malaise (feeling tired, not well), UPPER ABDOMINAL STOMACH cramps, and fever. One common bacterial cause of gastritis is an organism known as H. Pylori. This can be treated with antibiotics.

## 2011-08-27 NOTE — Interval H&P Note (Signed)
History and Physical Interval Note:  08/27/2011 10:19 AM  Victor Mccoy  has presented today for surgery, with the diagnosis of Esphageal Stricture  The various methods of treatment have been discussed with the patient and family. After consideration of risks, benefits and other options for treatment, the patient has consented to  Procedure(s) (LRB): ESOPHAGOGASTRODUODENOSCOPY (EGD) WITH ESOPHAGEAL DILATION (N/A) SAVORY DILATION (N/A) MALONEY DILATION (N/A) as a surgical intervention .  The patient's history has been reviewed, patient examined, no change in status, stable for surgery.  I have reviewed the patients' chart and labs.  Questions were answered to the patient's satisfaction.     [employer]

## 2011-08-27 NOTE — Op Note (Signed)
[facility] [address] [place], [place]  [address]  ENDOSCOPY PROCEDURE REPORT  PATIENT:  Victor Mccoy, Victor Mccoy  MR#:  [id number] BIRTHDATE:  Apr 09, 1960, 50 yrs. old  GENDER:  male  ENDOSCOPIST:  Jonette Eva, MD ASSISTANT: Referred by:  Carylon Perches, M.D.  PROCEDURE DATE:  08/27/2011 PROCEDURE:  EGD with dilatation over guidewire ASA CLASS: INDICATIONS:  DYSPHAGIA/FOOD IMPACTION  PT DENIES HEARTBURN/INDIGESTION/ABD PAIN.  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 EOSINOPHILIC ESOPHAGITIS  MEDICATIONS:   Demerol 75 mg IV, Versed 4 mg IV TOPICAL ANESTHETIC:  Cetacaine Spray  DESCRIPTION OF PROCEDURE:   After the risks benefits and alternatives of the procedure were thoroughly explained, informed consent was obtained.  The EG-[id number] ([id number]) endoscope was introduced through the mouth and advanced to the second portion of the duodenum.  The instrument was slowly withdrawn as the mucosa was carefully examined.  Prior to withdrawal of the scope, the guidwire was placed.  The esophagus was dilated successfully.  The patient was recovered in endoscopy and discharged home in satisfactory condition. <<PROCEDUREIMAGES>>  A stricture was found in the distal esophagus.  NO BARRETT'S. Mild gastritis was found.  NL DUODENUM.  Dilation was then performed at the distal esophagus.  1) Dilator:  Savary over guidewire  Size(s):  14-17 MM Resistance:  moderate  Heme:  none Appearance:  COMPLICATIONS:  None  ENDOSCOPIC IMPRESSION: 1) Stricture in the distal esophagus 2) Mild gastritis 3) DYSPHAGIA MOST LIKLEY DUE TO EOSINOPHILIC ESOPHAGITIS  RECOMMENDATIONS: START FLUTICASONE 220 MCG BID FOR 8 WEEKS PRILOSEC PRN LOW FAT DIET OPV SEP 2013  REPEAT EXAM:  No  ______________________________ Jonette Eva, MD  CC:  n. eSIGNED:   Kylen Schliep at 08/27/2011 01:35 PM  Arna Snipe, [phone]

## 2011-08-28 ENCOUNTER — Encounter (HOSPITAL_COMMUNITY): Payer: Self-pay | Admitting: Gastroenterology

## 2011-08-28 NOTE — Telephone Encounter (Signed)
PT HAS EE. S/P EGD/DIL JUN 2013. STARTED ON FLUTICASONE 220 MCG BID FOR 8 WEEKS.

## 2011-11-26 ENCOUNTER — Encounter: Payer: Self-pay | Admitting: *Deleted

## 2011-12-05 ENCOUNTER — Encounter: Payer: Self-pay | Admitting: *Deleted

## 2012-03-16 ENCOUNTER — Other Ambulatory Visit (HOSPITAL_COMMUNITY): Payer: Self-pay | Admitting: Internal Medicine

## 2012-03-16 ENCOUNTER — Ambulatory Visit (HOSPITAL_COMMUNITY)
Admission: RE | Admit: 2012-03-16 | Discharge: 2012-03-16 | Disposition: A | Discharge status: Home / Self Care | Payer: BC Managed Care – PPO | Source: Ambulatory Visit | Attending: Internal Medicine | Admitting: Internal Medicine

## 2012-03-16 DIAGNOSIS — R0602 Shortness of breath: Secondary | ICD-10-CM | POA: Insufficient documentation

## 2012-03-16 DIAGNOSIS — R079 Chest pain, unspecified: Secondary | ICD-10-CM | POA: Insufficient documentation

## 2012-09-22 ENCOUNTER — Other Ambulatory Visit: Payer: Self-pay | Admitting: Gastroenterology

## 2013-04-17 IMAGING — CR DG CHEST 2V
2 series · 2 of 2 positions shown · non-contrast
Comparison: None.

CLINICAL DATA: Hoarseness, possible foreign body.

CHEST - 2 VIEW

[view not recorded (1 of 2)]
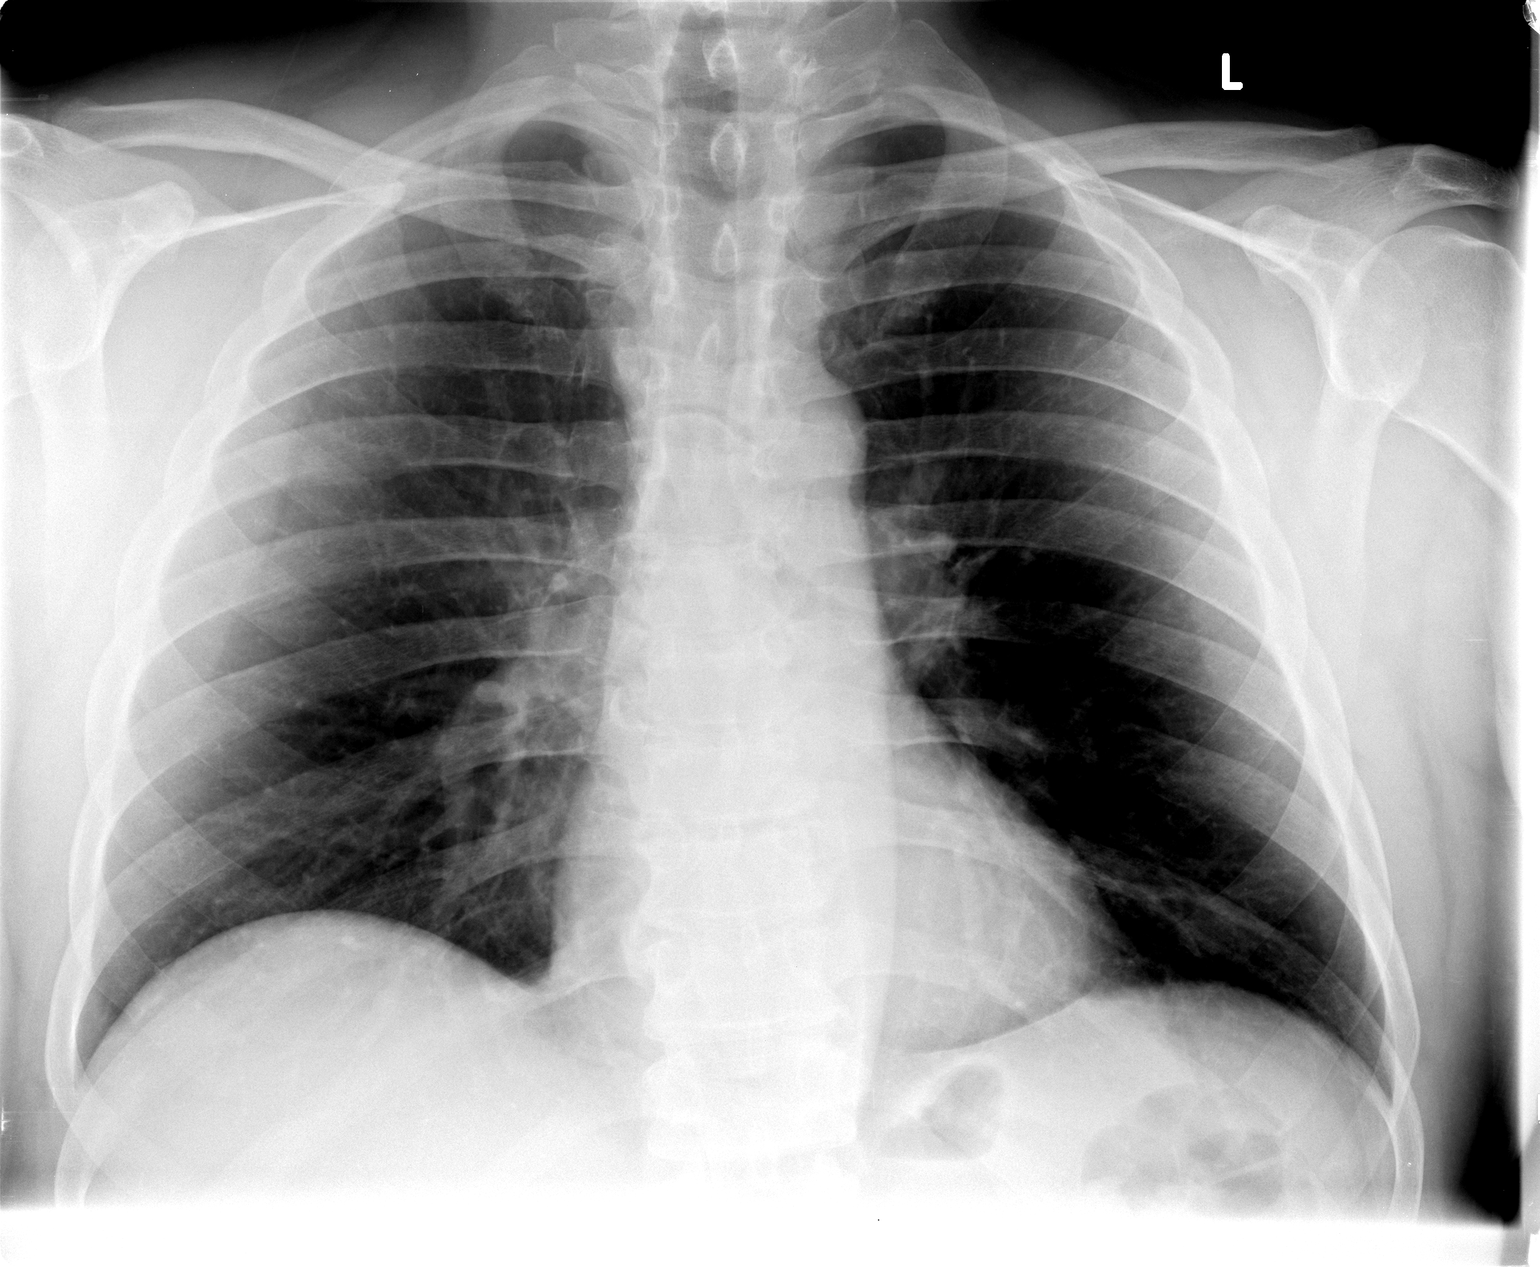

[view not recorded (2 of 2)]
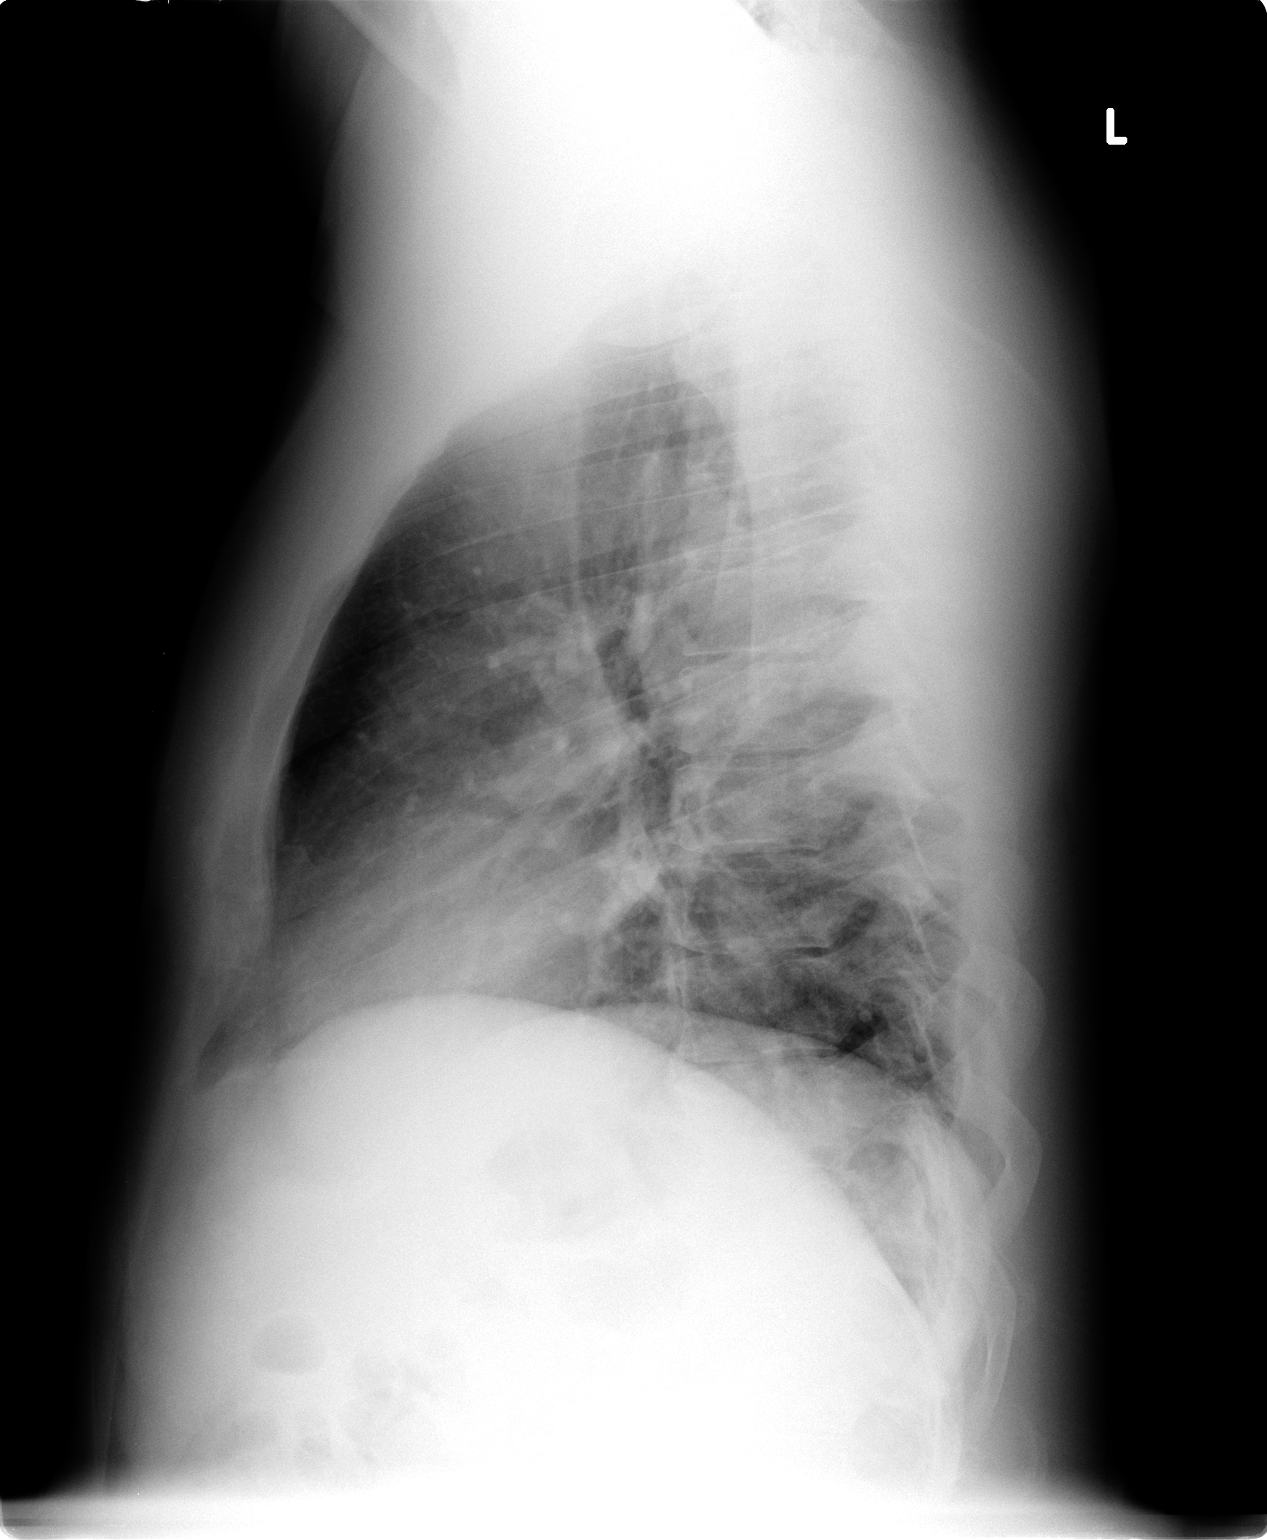

[2 of 2 positions shown; findings below may reference images not displayed]

FINDINGS: Lungs are clear. No pleural effusion or pneumothorax. The
cardiomediastinal contours are within normal limits. The visualized
bones and soft tissues are without significant appreciable
abnormality.
IMPRESSION: No radiopaque foreign body or radiographic evidence of acute
cardiopulmonary process.

## 2013-10-07 ENCOUNTER — Other Ambulatory Visit: Payer: Self-pay

## 2013-10-07 MED ORDER — OMEPRAZOLE 20 MG PO CPDR: 20.0000 mg | DELAYED_RELEASE_CAPSULE | Freq: Every day | ORAL | Status: DC

## 2013-11-13 IMAGING — CR DG CHEST 2V
2 series · 2 of 2 positions shown · non-contrast
Comparison: None.

CLINICAL DATA: Chest pain for 1 week.

CHEST - 2 VIEW

[view not recorded (1 of 2)]
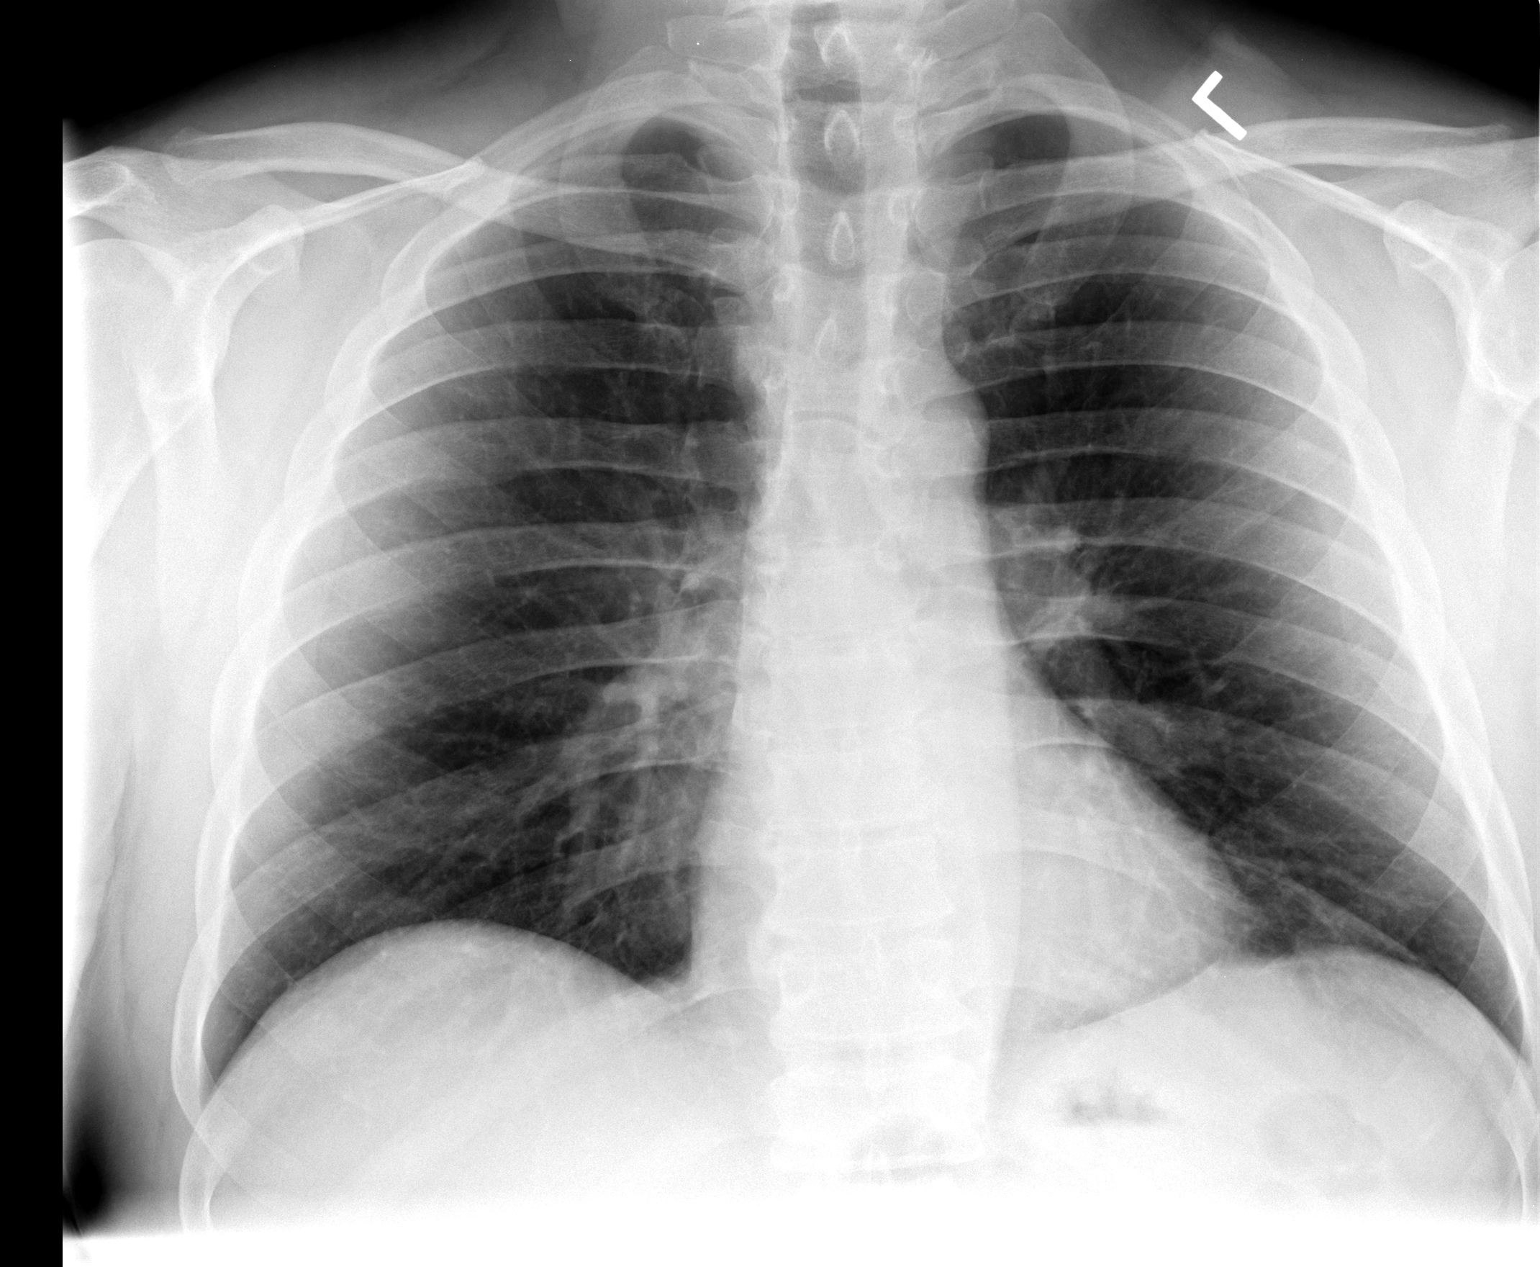

[view not recorded (2 of 2)]
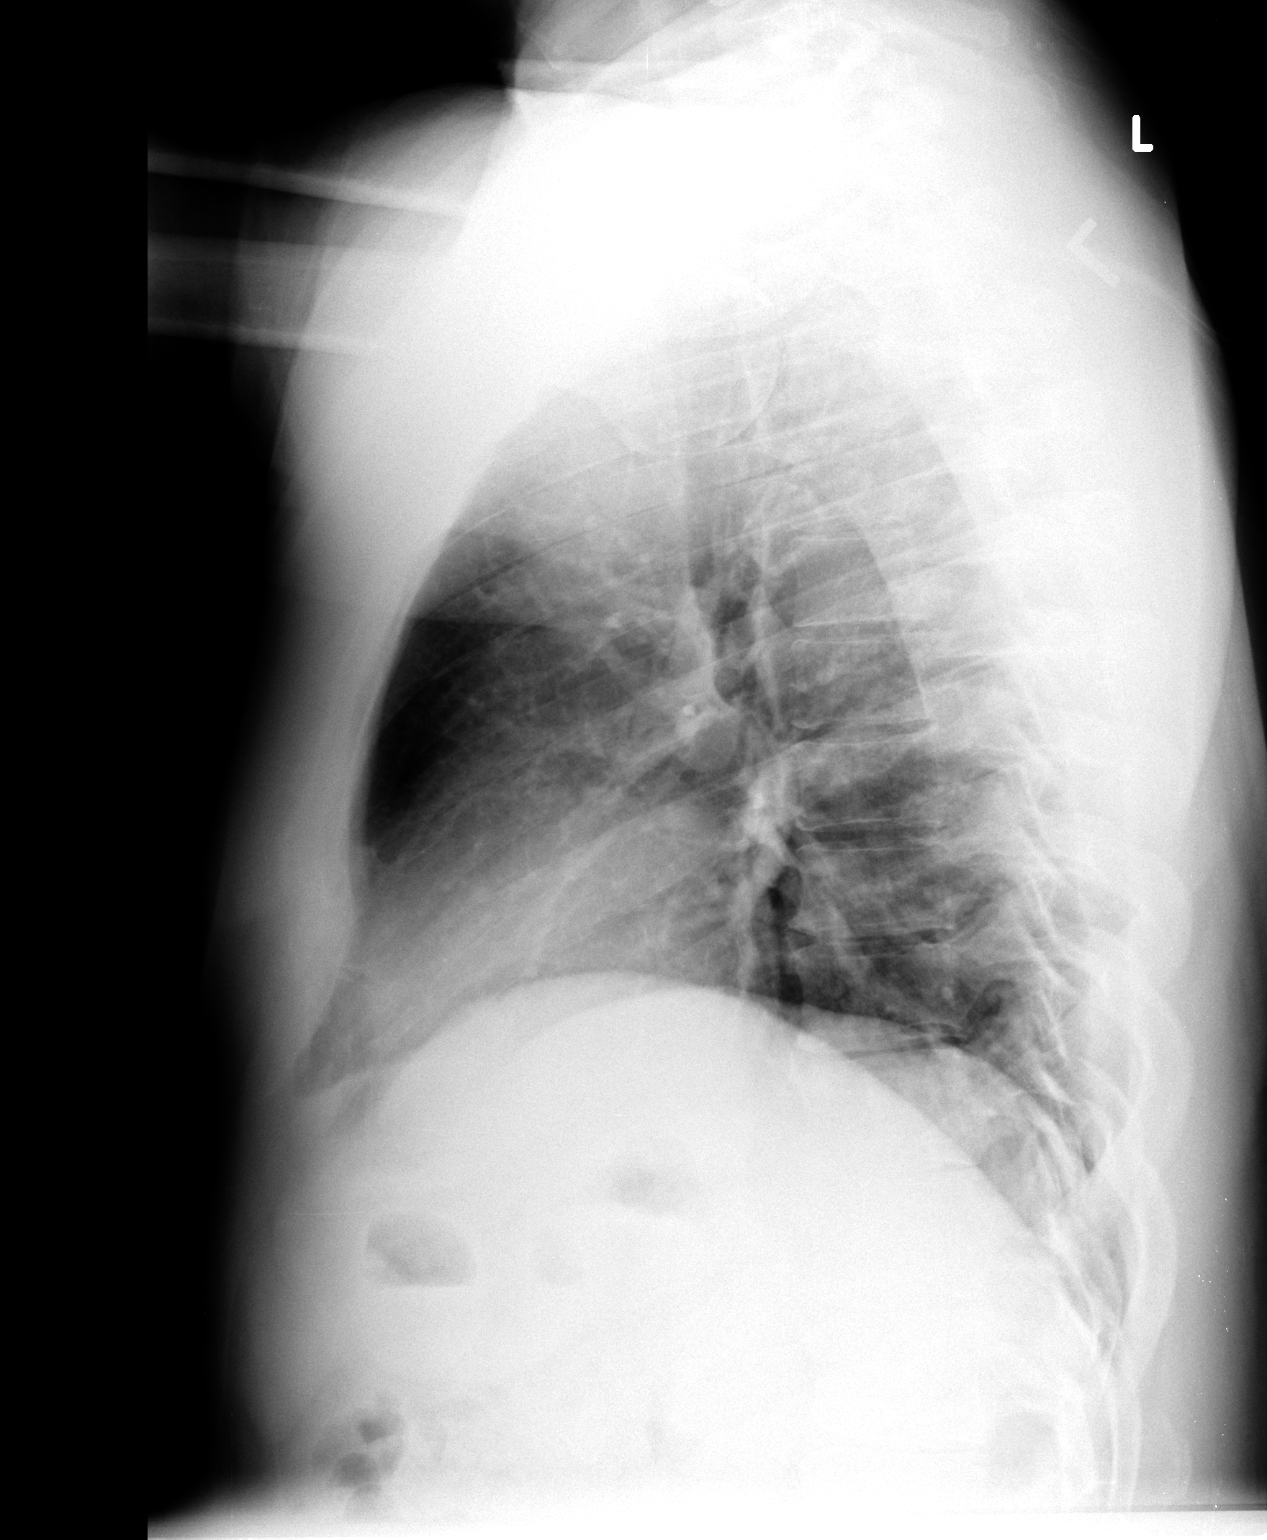

[2 of 2 positions shown; findings below may reference images not displayed]

FINDINGS: Midline trachea.  Normal heart size and mediastinal
contours. No pleural effusion or pneumothorax.  Clear lungs.
IMPRESSION: Normal chest.

## 2014-11-03 ENCOUNTER — Other Ambulatory Visit: Payer: Self-pay | Admitting: Gastroenterology

## 2015-05-05 ENCOUNTER — Other Ambulatory Visit: Payer: Self-pay | Admitting: Gastroenterology

## 2015-05-05 DIAGNOSIS — K219 Gastro-esophageal reflux disease without esophagitis: Secondary | ICD-10-CM

## 2015-05-05 NOTE — Telephone Encounter (Signed)
Please notify patient I can provide refills for 2 months, but we haven't seen her in 4 years and will need to see her for further refills.

## 2015-08-08 ENCOUNTER — Other Ambulatory Visit: Payer: Self-pay | Admitting: Nurse Practitioner

## 2015-08-11 ENCOUNTER — Other Ambulatory Visit: Payer: Self-pay | Admitting: Gastroenterology

## 2015-08-15 ENCOUNTER — Other Ambulatory Visit: Payer: Self-pay | Admitting: Nurse Practitioner

## 2015-08-16 ENCOUNTER — Telehealth: Payer: Self-pay | Admitting: Gastroenterology

## 2015-08-16 DIAGNOSIS — K219 Gastro-esophageal reflux disease without esophagitis: Secondary | ICD-10-CM

## 2015-08-16 MED ORDER — OMEPRAZOLE 20 MG PO CPDR: 20.0000 mg | DELAYED_RELEASE_CAPSULE | Freq: Every day | ORAL | Status: DC

## 2015-08-16 NOTE — Telephone Encounter (Signed)
A pharmacist from [facility] called to say that she has tried sending us a refill request 3 different times via E-request and it isn't going through.  I asked if this was something she needed to speak with the nurse about and she said, No, that I could just let the nurse know that patient needed a refill of Omeprazole.

## 2015-08-16 NOTE — Addendum Note (Signed)
Addended by: Delane GingerGILL, Prince Cou[place] A on: 08/16/2015 01:31 PM   Modules accepted: Orders

## 2015-08-16 NOTE — Telephone Encounter (Signed)
Sent to pharmacy 

## 2015-08-16 NOTE — Telephone Encounter (Signed)
Sending to the refill box.  

## 2016-03-06 ENCOUNTER — Other Ambulatory Visit: Payer: Self-pay | Admitting: Nurse Practitioner

## 2016-03-06 DIAGNOSIS — K219 Gastro-esophageal reflux disease without esophagitis: Secondary | ICD-10-CM

## 2016-03-07 NOTE — Telephone Encounter (Signed)
No refills to be provided as patient has not been seen in over 4 years. He can get omeprazole over-the-counter, from PCP or make an appointment here.

## 2016-03-07 NOTE — Telephone Encounter (Signed)
LMOM to call in regards to refill request and also mailed a letter.

## 2018-09-10 ENCOUNTER — Telehealth: Payer: Self-pay | Admitting: Gastroenterology

## 2018-09-10 NOTE — Telephone Encounter (Signed)
PATIENT WIFE SAID PATIENT NEEDED HIS FIRST TCS, DOES HE NEED A NURSE VISIT OR AN OFFICE VISIT?

## 2018-09-10 NOTE — Telephone Encounter (Signed)
We havent seen him since 2013 as an inpatient. He will need a referral first.

## 2020-12-06 ENCOUNTER — Other Ambulatory Visit: Payer: Self-pay

## 2020-12-06 ENCOUNTER — Encounter: Payer: Self-pay | Admitting: Physician Assistant

## 2020-12-06 ENCOUNTER — Ambulatory Visit: Payer: BC Managed Care – PPO | Admitting: Physician Assistant

## 2020-12-06 DIAGNOSIS — Z1283 Encounter for screening for malignant neoplasm of skin: Secondary | ICD-10-CM | POA: Diagnosis not present

## 2020-12-06 DIAGNOSIS — Z808 Family history of malignant neoplasm of other organs or systems: Secondary | ICD-10-CM

## 2020-12-06 NOTE — Progress Notes (Signed)
   New Patient   Subjective  Victor Mccoy is a 60 y.o. male who presents for the following: New Patient (Initial Visit) (Patient here today for skin check no concerns. No personal history of atypical moles, melanoma or non mole skin cancer. Per patient family history of non mole skin cancer. ).   The following portions of the chart were reviewed this encounter and updated as appropriate:      Objective  Well appearing patient in no apparent distress; mood and affect are within normal limits.  A full examination was performed including scalp, head, eyes, ears, nose, lips, neck, chest, axillae, abdomen, back, buttocks, bilateral upper extremities, bilateral lower extremities, hands, feet, fingers, toes, fingernails, and toenails. All findings within normal limits unless otherwise noted below.  Head to toe No atypical nevi No signs of non-mole skin cancer. Numerous seborrheic keratoses scattered with brown crusting.   Assessment & Plan  Encounter for screening for malignant neoplasm of skin Head to toe  Yearly skin exams     I, Yashica Sterbenz, PA-C, have reviewed all documentation's for this visit.  The documentation on 12/06/20 for the exam, diagnosis, procedures and orders are all accurate and complete.

## 2021-04-17 ENCOUNTER — Other Ambulatory Visit (HOSPITAL_COMMUNITY): Payer: Self-pay | Admitting: Internal Medicine

## 2021-04-17 DIAGNOSIS — R160 Hepatomegaly, not elsewhere classified: Secondary | ICD-10-CM

## 2021-04-27 ENCOUNTER — Ambulatory Visit (HOSPITAL_COMMUNITY)
Admission: RE | Admit: 2021-04-27 | Discharge: 2021-04-27 | Disposition: A | Discharge status: Home / Self Care | Payer: BC Managed Care – PPO | Source: Ambulatory Visit | Attending: Internal Medicine | Admitting: Internal Medicine

## 2021-04-27 ENCOUNTER — Other Ambulatory Visit: Payer: Self-pay

## 2021-04-27 DIAGNOSIS — R160 Hepatomegaly, not elsewhere classified: Secondary | ICD-10-CM | POA: Insufficient documentation

## 2021-12-06 ENCOUNTER — Ambulatory Visit: Payer: BC Managed Care – PPO | Admitting: Physician Assistant

## 2021-12-13 ENCOUNTER — Ambulatory Visit: Payer: BC Managed Care – PPO | Admitting: Physician Assistant

## 2023-11-01 ENCOUNTER — Emergency Department (HOSPITAL_COMMUNITY)

## 2023-11-01 ENCOUNTER — Other Ambulatory Visit: Payer: Self-pay

## 2023-11-01 ENCOUNTER — Emergency Department (HOSPITAL_COMMUNITY)
Admission: EM | Admit: 2023-11-01 | Discharge: 2023-11-01 | Disposition: A | Discharge status: Home / Self Care | Attending: Emergency Medicine | Admitting: Emergency Medicine

## 2023-11-01 ENCOUNTER — Encounter (HOSPITAL_COMMUNITY): Payer: Self-pay | Admitting: *Deleted

## 2023-11-01 DIAGNOSIS — Z23 Encounter for immunization: Secondary | ICD-10-CM | POA: Diagnosis not present

## 2023-11-01 DIAGNOSIS — W312XXA Contact with powered woodworking and forming machines, initial encounter: Secondary | ICD-10-CM | POA: Insufficient documentation

## 2023-11-01 DIAGNOSIS — Z7982 Long term (current) use of aspirin: Secondary | ICD-10-CM | POA: Insufficient documentation

## 2023-11-01 DIAGNOSIS — S60940A Unspecified superficial injury of right index finger, initial encounter: Secondary | ICD-10-CM | POA: Diagnosis present

## 2023-11-01 DIAGNOSIS — S61310A Laceration without foreign body of right index finger with damage to nail, initial encounter: Secondary | ICD-10-CM | POA: Insufficient documentation

## 2023-11-01 MED ORDER — SULFAMETHOXAZOLE-TRIMETHOPRIM 800-160 MG PO TABS: 1.0000 | ORAL_TABLET | Freq: Two times a day (BID) | ORAL | 0 refills | Status: DC

## 2023-11-01 MED ORDER — TETANUS-DIPHTH-ACELL PERTUSSIS 5-2.5-18.5 LF-MCG/0.5 IM SUSY
0.5000 mL | PREFILLED_SYRINGE | Freq: Once | INTRAMUSCULAR | Status: AC
  Administered 2023-11-01: 0.5 mL via INTRAMUSCULAR
  Filled 2023-11-01: qty 0.5

## 2023-11-01 MED ORDER — SULFAMETHOXAZOLE-TRIMETHOPRIM 800-160 MG PO TABS: 1.0000 | ORAL_TABLET | Freq: Two times a day (BID) | ORAL | 0 refills | Status: AC

## 2023-11-01 MED ORDER — LIDOCAINE HCL (PF) 1 % IJ SOLN
30.0000 mL | Freq: Once | INTRAMUSCULAR | Status: AC
Start: 2023-11-01 — End: 2023-11-01
  Administered 2023-11-01: 30 mL
  Filled 2023-11-01: qty 30

## 2023-11-01 NOTE — ED Provider Notes (Signed)
 [place] EMERGENCY DEPARTMENT AT [facility] Provider Note   CSN: [id number] Arrival date & time: 11/01/23  1206     Patient presents with: Finger Injury   Victor Mccoy is a 63 y.o. male who presents to the ED with a injury to the distal aspect of his right index finger.  He was using a wood processor when the machinery avulsed the tip of his right index finger.  Aside from pain, he has no other further injuries or further complaints at this time.  Unknown as to vaccination status regarding tetanus.   HPI     Prior to Admission medications   Medication Sig Start Date End Date Taking? Authorizing Provider  acetic acid-hydrocortisone (VOSOL-HC) OTIC solution  11/27/20   [provider]  amLODipine (NORVASC) 5 MG tablet Take 5 mg by mouth daily. 10/19/20   [provider]  aspirin 325 MG EC tablet aspirin 325 mg tablet,delayed release    [provider]  atorvastatin (LIPITOR) 40 MG tablet Take 40 mg by mouth daily as needed. 07/19/20   [provider]  dicyclomine (BENTYL) 10 MG capsule 2 capsules    [provider]  fenofibrate (TRICOR) 145 MG tablet Take 145 mg by mouth daily.    [provider]  fluticasone  (FLOVENT  HFA) 220 MCG/ACT inhaler 1 PUFF INTO BACK OF THROAT AND THEN SWALLOW. 1 PUFF TWICE A DAY FOR 8 WEEKS. 08/27/11 08/26/12  Fields, Sandi L, MD  gabapentin (NEURONTIN) 300 MG capsule gabapentin 300 mg capsule    [provider]  hyoscyamine (LEVSIN SL) 0.125 MG SL tablet Place under the tongue. 11/21/20   [provider]  ibuprofen (ADVIL) 200 MG tablet Take by mouth.    [provider]  LORazepam  (ATIVAN ) 0.5 MG tablet Take 0.5 mg by mouth 3 (three) times daily as needed. 06/20/20   [provider]  losartan (COZAAR) 50 MG tablet Take 50 mg by mouth daily. 10/13/20   [provider]  lovastatin (MEVACOR) 20 MG tablet 1 tablet with the evening meal    [provider]  neomycin-polymyxin-hydrocortisone (CORTISPORIN) 3.5-10000-1 OTIC suspension SMARTSIG:In Ear(s) 09/25/20   [provider]  ofloxacin (FLOXIN) 0.3 % OTIC solution SMARTSIG:In Ear(s) 08/22/20   [provider]  omeprazole  (PRILOSEC) 40 MG capsule Take by mouth. 09/08/20   [provider]  sertraline (ZOLOFT) 50 MG tablet Take 50 mg by mouth daily. 10/19/20   [provider]    Allergies: Patient has no known allergies.    Review of Systems  Skin:  Positive for wound.  All other systems reviewed and are negative.   Updated Vital Signs BP (!) 175/98   Pulse (!) 58   Temp 98.9 F (37.2 C)   Resp 16   Ht 5' 8 (1.727 m)   Wt 95 kg   SpO2 100%   BMI 31.84 kg/m   Physical Exam Vitals and nursing note reviewed.  Constitutional:      General: He is not in acute distress.    Appearance: He is well-developed.  HENT:     Head: Normocephalic and atraumatic.  Eyes:     Conjunctiva/sclera: Conjunctivae normal.  Cardiovascular:     Rate and Rhythm: Normal rate and regular rhythm.     Heart sounds: No murmur heard. Pulmonary:     Effort: Pulmonary effort is normal. No respiratory distress.     Breath sounds: Normal breath sounds.  Abdominal:     Palpations: Abdomen is  soft.     Tenderness: There is no abdominal tenderness.  Musculoskeletal:        General: No swelling.     Cervical back: Neck supple.  Skin:    General: Skin is warm and dry.     Capillary Refill: Capillary refill takes less than 2 seconds.     Comments: Distal tip of the right index finger is completely avulsed.  No remanent of that aspect of the finger remains.  Neurological:     Mental Status: He is alert.  Psychiatric:        Mood and Affect: Mood normal.        (all labs ordered are listed, but only abnormal results are displayed) Labs Reviewed - No data to display  EKG: None  Radiology: DG Finger Index Right Result Date: 11/01/2023 CLINICAL DATA:   Injury to the right index finger. EXAM: RIGHT INDEX FINGER 2+V COMPARISON:  None Available. FINDINGS: No acute fracture or dislocation. No significant arthritic changes. There is laceration of the soft tissues of the tip of the index finger. No radiopaque foreign object or soft tissue gas. Overlying dressing noted. IMPRESSION: 1. No acute fracture or dislocation. 2. Laceration of the soft tissues of the tip of the index finger. Electronically Signed   By: Vanetta Chou M.D.   On: 11/01/2023 13:26     Procedures   Medications Ordered in the ED  Tdap (BOOSTRIX ) injection 0.5 mL (0.5 mLs Intramuscular Given 11/01/23 1433)                                    Medical Decision Making Amount and/or Complexity of Data Reviewed Radiology: ordered.  Risk Prescription drug management.   Medical Decision Making:   Victor Mccoy is a 63 y.o. male who presented to the ED today with avulsion of the fingertip of the right index finger detailed above.     Complete initial physical exam performed, notably the patient  was alert oriented no apparent distress with avulsion of the distal aspect of the fingertip of the right index finger appreciated..    Reviewed and confirmed nursing documentation for past medical history, family history, social history.    Initial Assessment:   With the patient's presentation of finger avulsion, most likely diagnosis is superficial avulsion of the fingertip.. Other diagnoses were considered including (but not limited to) fracture of the underlying structures. These are considered less likely due to history of present illness and physical exam findings.     Initial Plan:  Obtain plain film imaging of the associated finger. Tetanus prophylaxis is indicated. Objective evaluation as below reviewed   Initial Study Results:    Radiology:  All images reviewed independently. Agree with radiology report at this time.   DG Finger Index Right Result Date:  11/01/2023 CLINICAL DATA:  Injury to the right index finger. EXAM: RIGHT INDEX FINGER 2+V COMPARISON:  None Available. FINDINGS: No acute fracture or dislocation. No significant arthritic changes. There is laceration of the soft tissues of the tip of the index finger. No radiopaque foreign object or soft tissue gas. Overlying dressing noted. IMPRESSION: 1. No acute fracture or dislocation. 2. Laceration of the soft tissues of the tip of the index finger. Electronically Signed   By: Vanetta Chou M.D.   On: 11/01/2023 13:26     Reassessment and Plan:   On evaluation of the exam findings, he does not  have a fracture of the underlying bony structures and appears to be a superficial avulsion of the skin overlying the fingertip.  Plan at this time is to provide wound prophylaxis as well as to keep bleeding controlled, provide closure of the distal fingertip.  Otherwise we will have him follow-up with hand surgery for further evaluation as needed.       Final diagnoses:  Laceration of right index finger without foreign body with damage to nail, initial encounter    ED Discharge Orders     None          Myriam Dorn BROCK[place], [place] 11/01/23 1454    Charlyn Sora, MD 11/02/23 1540

## 2023-11-01 NOTE — ED Triage Notes (Signed)
 States he got his right index finger caught between a wood splitter and has an avulsion to the tip of his right index finger bleeding controlled

## 2023-11-01 NOTE — ED Notes (Signed)
 Pt was given discharge instructions and verbalized understanding. Pt given opportunity to ask questions.
